# Patient Record
Sex: Male | Born: 1945
Health system: Southern US, Community
[De-identification: ages and names within clinical notes are randomized; demographics above are authoritative.]

## PROBLEM LIST (undated history)

## (undated) DIAGNOSIS — I429 Cardiomyopathy, unspecified: Secondary | ICD-10-CM

## (undated) DIAGNOSIS — E785 Hyperlipidemia, unspecified: Secondary | ICD-10-CM

## (undated) DIAGNOSIS — M109 Gout, unspecified: Secondary | ICD-10-CM

## (undated) DIAGNOSIS — E1121 Type 2 diabetes mellitus with diabetic nephropathy: Secondary | ICD-10-CM

## (undated) DIAGNOSIS — N400 Enlarged prostate without lower urinary tract symptoms: Secondary | ICD-10-CM

## (undated) DIAGNOSIS — Z8711 Personal history of peptic ulcer disease: Secondary | ICD-10-CM

## (undated) DIAGNOSIS — I5032 Chronic diastolic (congestive) heart failure: Secondary | ICD-10-CM

## (undated) DIAGNOSIS — I447 Left bundle-branch block, unspecified: Secondary | ICD-10-CM

## (undated) DIAGNOSIS — N189 Chronic kidney disease, unspecified: Secondary | ICD-10-CM

## (undated) DIAGNOSIS — I4891 Unspecified atrial fibrillation: Secondary | ICD-10-CM

## (undated) DIAGNOSIS — E119 Type 2 diabetes mellitus without complications: Secondary | ICD-10-CM

## (undated) DIAGNOSIS — I872 Venous insufficiency (chronic) (peripheral): Secondary | ICD-10-CM

## (undated) DIAGNOSIS — K219 Gastro-esophageal reflux disease without esophagitis: Secondary | ICD-10-CM

## (undated) DIAGNOSIS — D696 Thrombocytopenia, unspecified: Secondary | ICD-10-CM

## (undated) DIAGNOSIS — N183 Chronic kidney disease, stage 3 unspecified: Secondary | ICD-10-CM

## (undated) DIAGNOSIS — I509 Heart failure, unspecified: Secondary | ICD-10-CM

## (undated) DIAGNOSIS — I4719 Other supraventricular tachycardia: Secondary | ICD-10-CM

## (undated) HISTORY — DX: Thrombocytopenia, unspecified: D69.6

## (undated) HISTORY — DX: Type 2 diabetes mellitus with diabetic nephropathy: E11.21

## (undated) HISTORY — DX: Chronic diastolic (congestive) heart failure: I50.32

## (undated) HISTORY — DX: Cardiomyopathy, unspecified: I42.9

## (undated) HISTORY — DX: Venous insufficiency (chronic) (peripheral): I87.2

## (undated) HISTORY — DX: Personal history of peptic ulcer disease: Z87.11

## (undated) HISTORY — DX: Left bundle-branch block, unspecified: I44.7

## (undated) HISTORY — DX: Chronic kidney disease, stage 3 unspecified: N18.30

## (undated) HISTORY — DX: Benign prostatic hyperplasia without lower urinary tract symptoms: N40.0

## (undated) HISTORY — DX: Gastro-esophageal reflux disease without esophagitis: K21.9

## (undated) HISTORY — PX: HERNIA REPAIR: SHX51

## (undated) HISTORY — DX: Other supraventricular tachycardia: I47.19

## (undated) HISTORY — DX: Hyperlipidemia, unspecified: E78.5

---

## 2011-02-23 DIAGNOSIS — I10 Essential (primary) hypertension: Secondary | ICD-10-CM | POA: Diagnosis not present

## 2011-02-23 DIAGNOSIS — I509 Heart failure, unspecified: Secondary | ICD-10-CM | POA: Diagnosis not present

## 2011-06-01 DIAGNOSIS — E119 Type 2 diabetes mellitus without complications: Secondary | ICD-10-CM | POA: Diagnosis not present

## 2011-06-01 DIAGNOSIS — I4891 Unspecified atrial fibrillation: Secondary | ICD-10-CM | POA: Diagnosis not present

## 2011-06-01 DIAGNOSIS — N400 Enlarged prostate without lower urinary tract symptoms: Secondary | ICD-10-CM | POA: Diagnosis not present

## 2011-06-01 DIAGNOSIS — R3911 Hesitancy of micturition: Secondary | ICD-10-CM | POA: Diagnosis not present

## 2011-06-01 DIAGNOSIS — E785 Hyperlipidemia, unspecified: Secondary | ICD-10-CM | POA: Diagnosis not present

## 2011-07-05 DIAGNOSIS — K219 Gastro-esophageal reflux disease without esophagitis: Secondary | ICD-10-CM | POA: Diagnosis not present

## 2011-07-30 DIAGNOSIS — D126 Benign neoplasm of colon, unspecified: Secondary | ICD-10-CM | POA: Diagnosis not present

## 2011-07-30 DIAGNOSIS — Z794 Long term (current) use of insulin: Secondary | ICD-10-CM | POA: Diagnosis not present

## 2011-07-30 DIAGNOSIS — I509 Heart failure, unspecified: Secondary | ICD-10-CM | POA: Diagnosis not present

## 2011-07-30 DIAGNOSIS — I10 Essential (primary) hypertension: Secondary | ICD-10-CM | POA: Diagnosis not present

## 2011-07-30 DIAGNOSIS — Z8719 Personal history of other diseases of the digestive system: Secondary | ICD-10-CM | POA: Diagnosis not present

## 2011-07-30 DIAGNOSIS — Z7982 Long term (current) use of aspirin: Secondary | ICD-10-CM | POA: Diagnosis not present

## 2011-07-30 DIAGNOSIS — Z79899 Other long term (current) drug therapy: Secondary | ICD-10-CM | POA: Diagnosis not present

## 2011-07-30 DIAGNOSIS — E119 Type 2 diabetes mellitus without complications: Secondary | ICD-10-CM | POA: Diagnosis not present

## 2011-07-30 DIAGNOSIS — M129 Arthropathy, unspecified: Secondary | ICD-10-CM | POA: Diagnosis not present

## 2011-07-30 DIAGNOSIS — K219 Gastro-esophageal reflux disease without esophagitis: Secondary | ICD-10-CM | POA: Diagnosis not present

## 2011-07-30 DIAGNOSIS — Z1211 Encounter for screening for malignant neoplasm of colon: Secondary | ICD-10-CM | POA: Diagnosis not present

## 2011-07-30 DIAGNOSIS — I4891 Unspecified atrial fibrillation: Secondary | ICD-10-CM | POA: Diagnosis not present

## 2011-07-30 DIAGNOSIS — E785 Hyperlipidemia, unspecified: Secondary | ICD-10-CM | POA: Diagnosis not present

## 2011-09-08 DIAGNOSIS — I10 Essential (primary) hypertension: Secondary | ICD-10-CM | POA: Diagnosis not present

## 2011-09-08 DIAGNOSIS — E785 Hyperlipidemia, unspecified: Secondary | ICD-10-CM | POA: Diagnosis not present

## 2011-09-08 DIAGNOSIS — I4891 Unspecified atrial fibrillation: Secondary | ICD-10-CM | POA: Diagnosis not present

## 2011-09-15 DIAGNOSIS — Z23 Encounter for immunization: Secondary | ICD-10-CM | POA: Diagnosis not present

## 2011-12-13 DIAGNOSIS — I10 Essential (primary) hypertension: Secondary | ICD-10-CM | POA: Diagnosis not present

## 2011-12-13 DIAGNOSIS — E785 Hyperlipidemia, unspecified: Secondary | ICD-10-CM | POA: Diagnosis not present

## 2011-12-13 DIAGNOSIS — E119 Type 2 diabetes mellitus without complications: Secondary | ICD-10-CM | POA: Diagnosis not present

## 2012-03-16 DIAGNOSIS — E785 Hyperlipidemia, unspecified: Secondary | ICD-10-CM | POA: Diagnosis not present

## 2012-03-16 DIAGNOSIS — I10 Essential (primary) hypertension: Secondary | ICD-10-CM | POA: Diagnosis not present

## 2012-06-28 DIAGNOSIS — N400 Enlarged prostate without lower urinary tract symptoms: Secondary | ICD-10-CM | POA: Diagnosis not present

## 2012-06-28 DIAGNOSIS — I509 Heart failure, unspecified: Secondary | ICD-10-CM | POA: Diagnosis not present

## 2012-06-28 DIAGNOSIS — I4891 Unspecified atrial fibrillation: Secondary | ICD-10-CM | POA: Diagnosis not present

## 2012-06-28 DIAGNOSIS — L909 Atrophic disorder of skin, unspecified: Secondary | ICD-10-CM | POA: Diagnosis not present

## 2012-06-28 DIAGNOSIS — Z1212 Encounter for screening for malignant neoplasm of rectum: Secondary | ICD-10-CM | POA: Diagnosis not present

## 2012-06-28 DIAGNOSIS — E119 Type 2 diabetes mellitus without complications: Secondary | ICD-10-CM | POA: Diagnosis not present

## 2012-06-28 DIAGNOSIS — I10 Essential (primary) hypertension: Secondary | ICD-10-CM | POA: Diagnosis not present

## 2012-08-23 DIAGNOSIS — Z86010 Personal history of colonic polyps: Secondary | ICD-10-CM | POA: Diagnosis not present

## 2012-08-23 DIAGNOSIS — K219 Gastro-esophageal reflux disease without esophagitis: Secondary | ICD-10-CM | POA: Diagnosis not present

## 2012-09-21 DIAGNOSIS — Z860100 Personal history of colon polyps, unspecified: Secondary | ICD-10-CM | POA: Diagnosis not present

## 2012-09-21 DIAGNOSIS — E119 Type 2 diabetes mellitus without complications: Secondary | ICD-10-CM | POA: Diagnosis not present

## 2012-09-21 DIAGNOSIS — K648 Other hemorrhoids: Secondary | ICD-10-CM | POA: Diagnosis not present

## 2012-09-21 DIAGNOSIS — I509 Heart failure, unspecified: Secondary | ICD-10-CM | POA: Diagnosis not present

## 2012-09-21 DIAGNOSIS — I10 Essential (primary) hypertension: Secondary | ICD-10-CM | POA: Diagnosis not present

## 2012-09-21 DIAGNOSIS — I4891 Unspecified atrial fibrillation: Secondary | ICD-10-CM | POA: Diagnosis not present

## 2012-09-21 DIAGNOSIS — Z1211 Encounter for screening for malignant neoplasm of colon: Secondary | ICD-10-CM | POA: Diagnosis not present

## 2012-09-29 DIAGNOSIS — Z23 Encounter for immunization: Secondary | ICD-10-CM | POA: Diagnosis not present

## 2012-10-09 DIAGNOSIS — IMO0001 Reserved for inherently not codable concepts without codable children: Secondary | ICD-10-CM | POA: Diagnosis not present

## 2012-10-09 DIAGNOSIS — E785 Hyperlipidemia, unspecified: Secondary | ICD-10-CM | POA: Diagnosis not present

## 2012-10-09 DIAGNOSIS — E119 Type 2 diabetes mellitus without complications: Secondary | ICD-10-CM | POA: Diagnosis not present

## 2012-10-09 DIAGNOSIS — I10 Essential (primary) hypertension: Secondary | ICD-10-CM | POA: Diagnosis not present

## 2013-01-12 DIAGNOSIS — IMO0001 Reserved for inherently not codable concepts without codable children: Secondary | ICD-10-CM | POA: Diagnosis not present

## 2013-01-12 DIAGNOSIS — E785 Hyperlipidemia, unspecified: Secondary | ICD-10-CM | POA: Diagnosis not present

## 2013-03-17 DIAGNOSIS — A419 Sepsis, unspecified organism: Secondary | ICD-10-CM | POA: Diagnosis not present

## 2013-03-17 DIAGNOSIS — R7309 Other abnormal glucose: Secondary | ICD-10-CM | POA: Diagnosis not present

## 2013-03-17 DIAGNOSIS — I10 Essential (primary) hypertension: Secondary | ICD-10-CM | POA: Diagnosis not present

## 2013-03-17 DIAGNOSIS — M549 Dorsalgia, unspecified: Secondary | ICD-10-CM | POA: Diagnosis not present

## 2013-03-17 DIAGNOSIS — M7989 Other specified soft tissue disorders: Secondary | ICD-10-CM | POA: Diagnosis not present

## 2013-03-17 DIAGNOSIS — S298XXA Other specified injuries of thorax, initial encounter: Secondary | ICD-10-CM | POA: Diagnosis not present

## 2013-03-17 DIAGNOSIS — L03119 Cellulitis of unspecified part of limb: Secondary | ICD-10-CM | POA: Diagnosis not present

## 2013-03-17 DIAGNOSIS — M6282 Rhabdomyolysis: Secondary | ICD-10-CM | POA: Diagnosis not present

## 2013-03-17 DIAGNOSIS — E7800 Pure hypercholesterolemia, unspecified: Secondary | ICD-10-CM | POA: Diagnosis not present

## 2013-03-17 DIAGNOSIS — R0902 Hypoxemia: Secondary | ICD-10-CM | POA: Diagnosis not present

## 2013-03-17 DIAGNOSIS — I509 Heart failure, unspecified: Secondary | ICD-10-CM | POA: Diagnosis not present

## 2013-03-17 DIAGNOSIS — E1159 Type 2 diabetes mellitus with other circulatory complications: Secondary | ICD-10-CM | POA: Diagnosis not present

## 2013-03-17 DIAGNOSIS — M79609 Pain in unspecified limb: Secondary | ICD-10-CM | POA: Diagnosis not present

## 2013-03-17 DIAGNOSIS — I2510 Atherosclerotic heart disease of native coronary artery without angina pectoris: Secondary | ICD-10-CM | POA: Diagnosis not present

## 2013-03-17 DIAGNOSIS — Z23 Encounter for immunization: Secondary | ICD-10-CM | POA: Diagnosis not present

## 2013-03-17 DIAGNOSIS — E1169 Type 2 diabetes mellitus with other specified complication: Secondary | ICD-10-CM | POA: Diagnosis not present

## 2013-03-17 DIAGNOSIS — J9600 Acute respiratory failure, unspecified whether with hypoxia or hypercapnia: Secondary | ICD-10-CM | POA: Diagnosis not present

## 2013-03-17 DIAGNOSIS — Z794 Long term (current) use of insulin: Secondary | ICD-10-CM | POA: Diagnosis not present

## 2013-03-17 DIAGNOSIS — Z880 Allergy status to penicillin: Secondary | ICD-10-CM | POA: Diagnosis not present

## 2013-03-17 DIAGNOSIS — Z79899 Other long term (current) drug therapy: Secondary | ICD-10-CM | POA: Diagnosis not present

## 2013-03-17 DIAGNOSIS — Z452 Encounter for adjustment and management of vascular access device: Secondary | ICD-10-CM | POA: Diagnosis not present

## 2013-03-17 DIAGNOSIS — R262 Difficulty in walking, not elsewhere classified: Secondary | ICD-10-CM | POA: Diagnosis not present

## 2013-03-17 DIAGNOSIS — Z7401 Bed confinement status: Secondary | ICD-10-CM | POA: Diagnosis not present

## 2013-03-17 DIAGNOSIS — M5137 Other intervertebral disc degeneration, lumbosacral region: Secondary | ICD-10-CM | POA: Diagnosis not present

## 2013-03-17 DIAGNOSIS — G8929 Other chronic pain: Secondary | ICD-10-CM | POA: Diagnosis present

## 2013-03-17 DIAGNOSIS — D72829 Elevated white blood cell count, unspecified: Secondary | ICD-10-CM | POA: Diagnosis not present

## 2013-03-17 DIAGNOSIS — R52 Pain, unspecified: Secondary | ICD-10-CM | POA: Diagnosis not present

## 2013-03-17 DIAGNOSIS — M47817 Spondylosis without myelopathy or radiculopathy, lumbosacral region: Secondary | ICD-10-CM | POA: Diagnosis not present

## 2013-03-17 DIAGNOSIS — I999 Unspecified disorder of circulatory system: Secondary | ICD-10-CM | POA: Diagnosis not present

## 2013-03-17 DIAGNOSIS — IMO0001 Reserved for inherently not codable concepts without codable children: Secondary | ICD-10-CM | POA: Diagnosis not present

## 2013-03-17 DIAGNOSIS — D696 Thrombocytopenia, unspecified: Secondary | ICD-10-CM | POA: Diagnosis present

## 2013-03-17 DIAGNOSIS — R0602 Shortness of breath: Secondary | ICD-10-CM | POA: Diagnosis not present

## 2013-03-17 DIAGNOSIS — L02419 Cutaneous abscess of limb, unspecified: Secondary | ICD-10-CM | POA: Diagnosis not present

## 2013-03-17 DIAGNOSIS — I798 Other disorders of arteries, arterioles and capillaries in diseases classified elsewhere: Secondary | ICD-10-CM | POA: Diagnosis not present

## 2013-03-17 DIAGNOSIS — M2550 Pain in unspecified joint: Secondary | ICD-10-CM | POA: Diagnosis not present

## 2013-03-17 DIAGNOSIS — K824 Cholesterolosis of gallbladder: Secondary | ICD-10-CM | POA: Diagnosis present

## 2013-03-17 DIAGNOSIS — Z6841 Body Mass Index (BMI) 40.0 and over, adult: Secondary | ICD-10-CM | POA: Diagnosis not present

## 2013-03-17 DIAGNOSIS — N179 Acute kidney failure, unspecified: Secondary | ICD-10-CM | POA: Diagnosis present

## 2013-03-24 DIAGNOSIS — E785 Hyperlipidemia, unspecified: Secondary | ICD-10-CM | POA: Diagnosis not present

## 2013-03-24 DIAGNOSIS — A4902 Methicillin resistant Staphylococcus aureus infection, unspecified site: Secondary | ICD-10-CM | POA: Diagnosis not present

## 2013-03-24 DIAGNOSIS — K824 Cholesterolosis of gallbladder: Secondary | ICD-10-CM | POA: Diagnosis not present

## 2013-03-24 DIAGNOSIS — Z6841 Body Mass Index (BMI) 40.0 and over, adult: Secondary | ICD-10-CM | POA: Diagnosis not present

## 2013-03-24 DIAGNOSIS — R262 Difficulty in walking, not elsewhere classified: Secondary | ICD-10-CM | POA: Diagnosis not present

## 2013-03-24 DIAGNOSIS — Z7401 Bed confinement status: Secondary | ICD-10-CM | POA: Diagnosis not present

## 2013-03-24 DIAGNOSIS — I2510 Atherosclerotic heart disease of native coronary artery without angina pectoris: Secondary | ICD-10-CM | POA: Diagnosis not present

## 2013-03-24 DIAGNOSIS — I509 Heart failure, unspecified: Secondary | ICD-10-CM | POA: Diagnosis not present

## 2013-03-24 DIAGNOSIS — Z79899 Other long term (current) drug therapy: Secondary | ICD-10-CM | POA: Diagnosis not present

## 2013-03-24 DIAGNOSIS — E7800 Pure hypercholesterolemia, unspecified: Secondary | ICD-10-CM | POA: Diagnosis not present

## 2013-03-24 DIAGNOSIS — D72829 Elevated white blood cell count, unspecified: Secondary | ICD-10-CM | POA: Diagnosis not present

## 2013-03-24 DIAGNOSIS — L02419 Cutaneous abscess of limb, unspecified: Secondary | ICD-10-CM | POA: Diagnosis not present

## 2013-03-24 DIAGNOSIS — A419 Sepsis, unspecified organism: Secondary | ICD-10-CM | POA: Diagnosis not present

## 2013-03-24 DIAGNOSIS — I872 Venous insufficiency (chronic) (peripheral): Secondary | ICD-10-CM | POA: Diagnosis not present

## 2013-03-24 DIAGNOSIS — I130 Hypertensive heart and chronic kidney disease with heart failure and stage 1 through stage 4 chronic kidney disease, or unspecified chronic kidney disease: Secondary | ICD-10-CM | POA: Diagnosis not present

## 2013-03-24 DIAGNOSIS — N189 Chronic kidney disease, unspecified: Secondary | ICD-10-CM | POA: Diagnosis not present

## 2013-03-24 DIAGNOSIS — E1159 Type 2 diabetes mellitus with other circulatory complications: Secondary | ICD-10-CM | POA: Diagnosis not present

## 2013-03-24 DIAGNOSIS — I798 Other disorders of arteries, arterioles and capillaries in diseases classified elsewhere: Secondary | ICD-10-CM | POA: Diagnosis not present

## 2013-03-24 DIAGNOSIS — N039 Chronic nephritic syndrome with unspecified morphologic changes: Secondary | ICD-10-CM | POA: Diagnosis not present

## 2013-03-24 DIAGNOSIS — IMO0001 Reserved for inherently not codable concepts without codable children: Secondary | ICD-10-CM | POA: Diagnosis not present

## 2013-03-24 DIAGNOSIS — Z23 Encounter for immunization: Secondary | ICD-10-CM | POA: Diagnosis not present

## 2013-03-24 DIAGNOSIS — R5381 Other malaise: Secondary | ICD-10-CM | POA: Diagnosis not present

## 2013-03-24 DIAGNOSIS — Z880 Allergy status to penicillin: Secondary | ICD-10-CM | POA: Diagnosis not present

## 2013-03-24 DIAGNOSIS — N183 Chronic kidney disease, stage 3 unspecified: Secondary | ICD-10-CM | POA: Diagnosis not present

## 2013-03-24 DIAGNOSIS — D696 Thrombocytopenia, unspecified: Secondary | ICD-10-CM | POA: Diagnosis not present

## 2013-03-24 DIAGNOSIS — I7789 Other specified disorders of arteries and arterioles: Secondary | ICD-10-CM | POA: Diagnosis not present

## 2013-03-24 DIAGNOSIS — M6282 Rhabdomyolysis: Secondary | ICD-10-CM | POA: Diagnosis not present

## 2013-03-24 DIAGNOSIS — Z794 Long term (current) use of insulin: Secondary | ICD-10-CM | POA: Diagnosis not present

## 2013-03-24 DIAGNOSIS — M549 Dorsalgia, unspecified: Secondary | ICD-10-CM | POA: Diagnosis not present

## 2013-03-24 DIAGNOSIS — M2550 Pain in unspecified joint: Secondary | ICD-10-CM | POA: Diagnosis not present

## 2013-03-24 DIAGNOSIS — E7889 Other lipoprotein metabolism disorders: Secondary | ICD-10-CM | POA: Diagnosis not present

## 2013-03-24 DIAGNOSIS — I10 Essential (primary) hypertension: Secondary | ICD-10-CM | POA: Diagnosis not present

## 2013-03-24 DIAGNOSIS — R7309 Other abnormal glucose: Secondary | ICD-10-CM | POA: Diagnosis not present

## 2013-03-24 DIAGNOSIS — G8929 Other chronic pain: Secondary | ICD-10-CM | POA: Diagnosis not present

## 2013-03-24 DIAGNOSIS — J9600 Acute respiratory failure, unspecified whether with hypoxia or hypercapnia: Secondary | ICD-10-CM | POA: Diagnosis not present

## 2013-03-24 DIAGNOSIS — N179 Acute kidney failure, unspecified: Secondary | ICD-10-CM | POA: Diagnosis not present

## 2013-03-24 DIAGNOSIS — R52 Pain, unspecified: Secondary | ICD-10-CM | POA: Diagnosis not present

## 2013-03-26 DIAGNOSIS — N183 Chronic kidney disease, stage 3 unspecified: Secondary | ICD-10-CM | POA: Diagnosis not present

## 2013-03-26 DIAGNOSIS — E1159 Type 2 diabetes mellitus with other circulatory complications: Secondary | ICD-10-CM | POA: Diagnosis not present

## 2013-03-26 DIAGNOSIS — L02419 Cutaneous abscess of limb, unspecified: Secondary | ICD-10-CM | POA: Diagnosis not present

## 2013-03-26 DIAGNOSIS — I130 Hypertensive heart and chronic kidney disease with heart failure and stage 1 through stage 4 chronic kidney disease, or unspecified chronic kidney disease: Secondary | ICD-10-CM | POA: Diagnosis not present

## 2013-03-26 DIAGNOSIS — L03119 Cellulitis of unspecified part of limb: Secondary | ICD-10-CM | POA: Diagnosis not present

## 2013-04-14 DIAGNOSIS — I890 Lymphedema, not elsewhere classified: Secondary | ICD-10-CM | POA: Diagnosis not present

## 2013-04-14 DIAGNOSIS — M1710 Unilateral primary osteoarthritis, unspecified knee: Secondary | ICD-10-CM | POA: Diagnosis not present

## 2013-04-14 DIAGNOSIS — I798 Other disorders of arteries, arterioles and capillaries in diseases classified elsewhere: Secondary | ICD-10-CM | POA: Diagnosis not present

## 2013-04-14 DIAGNOSIS — E1159 Type 2 diabetes mellitus with other circulatory complications: Secondary | ICD-10-CM | POA: Diagnosis not present

## 2013-04-14 DIAGNOSIS — I129 Hypertensive chronic kidney disease with stage 1 through stage 4 chronic kidney disease, or unspecified chronic kidney disease: Secondary | ICD-10-CM | POA: Diagnosis not present

## 2013-04-14 DIAGNOSIS — M6282 Rhabdomyolysis: Secondary | ICD-10-CM | POA: Diagnosis not present

## 2013-04-14 DIAGNOSIS — L97809 Non-pressure chronic ulcer of other part of unspecified lower leg with unspecified severity: Secondary | ICD-10-CM | POA: Diagnosis not present

## 2013-04-14 DIAGNOSIS — I872 Venous insufficiency (chronic) (peripheral): Secondary | ICD-10-CM | POA: Diagnosis not present

## 2013-04-14 DIAGNOSIS — L97209 Non-pressure chronic ulcer of unspecified calf with unspecified severity: Secondary | ICD-10-CM | POA: Diagnosis not present

## 2013-04-14 DIAGNOSIS — Z794 Long term (current) use of insulin: Secondary | ICD-10-CM | POA: Diagnosis not present

## 2013-04-14 DIAGNOSIS — Z9181 History of falling: Secondary | ICD-10-CM | POA: Diagnosis not present

## 2013-04-14 DIAGNOSIS — N189 Chronic kidney disease, unspecified: Secondary | ICD-10-CM | POA: Diagnosis not present

## 2013-04-17 DIAGNOSIS — I798 Other disorders of arteries, arterioles and capillaries in diseases classified elsewhere: Secondary | ICD-10-CM | POA: Diagnosis not present

## 2013-04-17 DIAGNOSIS — L97209 Non-pressure chronic ulcer of unspecified calf with unspecified severity: Secondary | ICD-10-CM | POA: Diagnosis not present

## 2013-04-17 DIAGNOSIS — I872 Venous insufficiency (chronic) (peripheral): Secondary | ICD-10-CM | POA: Diagnosis not present

## 2013-04-17 DIAGNOSIS — E1159 Type 2 diabetes mellitus with other circulatory complications: Secondary | ICD-10-CM | POA: Diagnosis not present

## 2013-04-17 DIAGNOSIS — L97809 Non-pressure chronic ulcer of other part of unspecified lower leg with unspecified severity: Secondary | ICD-10-CM | POA: Diagnosis not present

## 2013-04-17 DIAGNOSIS — M6282 Rhabdomyolysis: Secondary | ICD-10-CM | POA: Diagnosis not present

## 2013-04-19 DIAGNOSIS — I10 Essential (primary) hypertension: Secondary | ICD-10-CM | POA: Diagnosis not present

## 2013-04-19 DIAGNOSIS — E785 Hyperlipidemia, unspecified: Secondary | ICD-10-CM | POA: Diagnosis not present

## 2013-04-19 DIAGNOSIS — E119 Type 2 diabetes mellitus without complications: Secondary | ICD-10-CM | POA: Diagnosis not present

## 2013-04-20 DIAGNOSIS — I872 Venous insufficiency (chronic) (peripheral): Secondary | ICD-10-CM | POA: Diagnosis not present

## 2013-04-20 DIAGNOSIS — M6282 Rhabdomyolysis: Secondary | ICD-10-CM | POA: Diagnosis not present

## 2013-04-20 DIAGNOSIS — L97809 Non-pressure chronic ulcer of other part of unspecified lower leg with unspecified severity: Secondary | ICD-10-CM | POA: Diagnosis not present

## 2013-04-20 DIAGNOSIS — L97209 Non-pressure chronic ulcer of unspecified calf with unspecified severity: Secondary | ICD-10-CM | POA: Diagnosis not present

## 2013-04-20 DIAGNOSIS — E1159 Type 2 diabetes mellitus with other circulatory complications: Secondary | ICD-10-CM | POA: Diagnosis not present

## 2013-04-20 DIAGNOSIS — I798 Other disorders of arteries, arterioles and capillaries in diseases classified elsewhere: Secondary | ICD-10-CM | POA: Diagnosis not present

## 2013-04-23 DIAGNOSIS — L97809 Non-pressure chronic ulcer of other part of unspecified lower leg with unspecified severity: Secondary | ICD-10-CM | POA: Diagnosis not present

## 2013-04-23 DIAGNOSIS — I872 Venous insufficiency (chronic) (peripheral): Secondary | ICD-10-CM | POA: Diagnosis not present

## 2013-04-23 DIAGNOSIS — M6282 Rhabdomyolysis: Secondary | ICD-10-CM | POA: Diagnosis not present

## 2013-04-23 DIAGNOSIS — I798 Other disorders of arteries, arterioles and capillaries in diseases classified elsewhere: Secondary | ICD-10-CM | POA: Diagnosis not present

## 2013-04-23 DIAGNOSIS — E1159 Type 2 diabetes mellitus with other circulatory complications: Secondary | ICD-10-CM | POA: Diagnosis not present

## 2013-04-23 DIAGNOSIS — L97209 Non-pressure chronic ulcer of unspecified calf with unspecified severity: Secondary | ICD-10-CM | POA: Diagnosis not present

## 2013-04-26 DIAGNOSIS — M6282 Rhabdomyolysis: Secondary | ICD-10-CM | POA: Diagnosis not present

## 2013-04-26 DIAGNOSIS — E1159 Type 2 diabetes mellitus with other circulatory complications: Secondary | ICD-10-CM | POA: Diagnosis not present

## 2013-04-26 DIAGNOSIS — I798 Other disorders of arteries, arterioles and capillaries in diseases classified elsewhere: Secondary | ICD-10-CM | POA: Diagnosis not present

## 2013-04-26 DIAGNOSIS — I872 Venous insufficiency (chronic) (peripheral): Secondary | ICD-10-CM | POA: Diagnosis not present

## 2013-04-26 DIAGNOSIS — L97809 Non-pressure chronic ulcer of other part of unspecified lower leg with unspecified severity: Secondary | ICD-10-CM | POA: Diagnosis not present

## 2013-04-26 DIAGNOSIS — L97209 Non-pressure chronic ulcer of unspecified calf with unspecified severity: Secondary | ICD-10-CM | POA: Diagnosis not present

## 2013-04-30 DIAGNOSIS — I798 Other disorders of arteries, arterioles and capillaries in diseases classified elsewhere: Secondary | ICD-10-CM | POA: Diagnosis not present

## 2013-04-30 DIAGNOSIS — I872 Venous insufficiency (chronic) (peripheral): Secondary | ICD-10-CM | POA: Diagnosis not present

## 2013-04-30 DIAGNOSIS — L97209 Non-pressure chronic ulcer of unspecified calf with unspecified severity: Secondary | ICD-10-CM | POA: Diagnosis not present

## 2013-04-30 DIAGNOSIS — E1159 Type 2 diabetes mellitus with other circulatory complications: Secondary | ICD-10-CM | POA: Diagnosis not present

## 2013-04-30 DIAGNOSIS — L97809 Non-pressure chronic ulcer of other part of unspecified lower leg with unspecified severity: Secondary | ICD-10-CM | POA: Diagnosis not present

## 2013-04-30 DIAGNOSIS — M6282 Rhabdomyolysis: Secondary | ICD-10-CM | POA: Diagnosis not present

## 2013-05-03 DIAGNOSIS — E1159 Type 2 diabetes mellitus with other circulatory complications: Secondary | ICD-10-CM | POA: Diagnosis not present

## 2013-05-03 DIAGNOSIS — I872 Venous insufficiency (chronic) (peripheral): Secondary | ICD-10-CM | POA: Diagnosis not present

## 2013-05-03 DIAGNOSIS — L97209 Non-pressure chronic ulcer of unspecified calf with unspecified severity: Secondary | ICD-10-CM | POA: Diagnosis not present

## 2013-05-03 DIAGNOSIS — M6282 Rhabdomyolysis: Secondary | ICD-10-CM | POA: Diagnosis not present

## 2013-05-03 DIAGNOSIS — L97809 Non-pressure chronic ulcer of other part of unspecified lower leg with unspecified severity: Secondary | ICD-10-CM | POA: Diagnosis not present

## 2013-05-03 DIAGNOSIS — I798 Other disorders of arteries, arterioles and capillaries in diseases classified elsewhere: Secondary | ICD-10-CM | POA: Diagnosis not present

## 2013-05-07 DIAGNOSIS — E1159 Type 2 diabetes mellitus with other circulatory complications: Secondary | ICD-10-CM | POA: Diagnosis not present

## 2013-05-07 DIAGNOSIS — I872 Venous insufficiency (chronic) (peripheral): Secondary | ICD-10-CM | POA: Diagnosis not present

## 2013-05-07 DIAGNOSIS — I798 Other disorders of arteries, arterioles and capillaries in diseases classified elsewhere: Secondary | ICD-10-CM | POA: Diagnosis not present

## 2013-05-07 DIAGNOSIS — L97209 Non-pressure chronic ulcer of unspecified calf with unspecified severity: Secondary | ICD-10-CM | POA: Diagnosis not present

## 2013-05-07 DIAGNOSIS — L97809 Non-pressure chronic ulcer of other part of unspecified lower leg with unspecified severity: Secondary | ICD-10-CM | POA: Diagnosis not present

## 2013-05-07 DIAGNOSIS — M6282 Rhabdomyolysis: Secondary | ICD-10-CM | POA: Diagnosis not present

## 2013-05-10 DIAGNOSIS — M6282 Rhabdomyolysis: Secondary | ICD-10-CM | POA: Diagnosis not present

## 2013-05-10 DIAGNOSIS — L97809 Non-pressure chronic ulcer of other part of unspecified lower leg with unspecified severity: Secondary | ICD-10-CM | POA: Diagnosis not present

## 2013-05-10 DIAGNOSIS — L97209 Non-pressure chronic ulcer of unspecified calf with unspecified severity: Secondary | ICD-10-CM | POA: Diagnosis not present

## 2013-05-10 DIAGNOSIS — I798 Other disorders of arteries, arterioles and capillaries in diseases classified elsewhere: Secondary | ICD-10-CM | POA: Diagnosis not present

## 2013-05-10 DIAGNOSIS — E1159 Type 2 diabetes mellitus with other circulatory complications: Secondary | ICD-10-CM | POA: Diagnosis not present

## 2013-05-10 DIAGNOSIS — I872 Venous insufficiency (chronic) (peripheral): Secondary | ICD-10-CM | POA: Diagnosis not present

## 2013-05-14 DIAGNOSIS — L97209 Non-pressure chronic ulcer of unspecified calf with unspecified severity: Secondary | ICD-10-CM | POA: Diagnosis not present

## 2013-05-14 DIAGNOSIS — M6282 Rhabdomyolysis: Secondary | ICD-10-CM | POA: Diagnosis not present

## 2013-05-14 DIAGNOSIS — I872 Venous insufficiency (chronic) (peripheral): Secondary | ICD-10-CM | POA: Diagnosis not present

## 2013-05-14 DIAGNOSIS — E1159 Type 2 diabetes mellitus with other circulatory complications: Secondary | ICD-10-CM | POA: Diagnosis not present

## 2013-05-14 DIAGNOSIS — I798 Other disorders of arteries, arterioles and capillaries in diseases classified elsewhere: Secondary | ICD-10-CM | POA: Diagnosis not present

## 2013-05-14 DIAGNOSIS — L97809 Non-pressure chronic ulcer of other part of unspecified lower leg with unspecified severity: Secondary | ICD-10-CM | POA: Diagnosis not present

## 2013-05-17 DIAGNOSIS — L97809 Non-pressure chronic ulcer of other part of unspecified lower leg with unspecified severity: Secondary | ICD-10-CM | POA: Diagnosis not present

## 2013-05-17 DIAGNOSIS — M6282 Rhabdomyolysis: Secondary | ICD-10-CM | POA: Diagnosis not present

## 2013-05-17 DIAGNOSIS — E1159 Type 2 diabetes mellitus with other circulatory complications: Secondary | ICD-10-CM | POA: Diagnosis not present

## 2013-05-17 DIAGNOSIS — I798 Other disorders of arteries, arterioles and capillaries in diseases classified elsewhere: Secondary | ICD-10-CM | POA: Diagnosis not present

## 2013-05-17 DIAGNOSIS — L97209 Non-pressure chronic ulcer of unspecified calf with unspecified severity: Secondary | ICD-10-CM | POA: Diagnosis not present

## 2013-05-17 DIAGNOSIS — I872 Venous insufficiency (chronic) (peripheral): Secondary | ICD-10-CM | POA: Diagnosis not present

## 2013-05-22 DIAGNOSIS — L97209 Non-pressure chronic ulcer of unspecified calf with unspecified severity: Secondary | ICD-10-CM | POA: Diagnosis not present

## 2013-05-22 DIAGNOSIS — I798 Other disorders of arteries, arterioles and capillaries in diseases classified elsewhere: Secondary | ICD-10-CM | POA: Diagnosis not present

## 2013-05-22 DIAGNOSIS — I872 Venous insufficiency (chronic) (peripheral): Secondary | ICD-10-CM | POA: Diagnosis not present

## 2013-05-22 DIAGNOSIS — L97809 Non-pressure chronic ulcer of other part of unspecified lower leg with unspecified severity: Secondary | ICD-10-CM | POA: Diagnosis not present

## 2013-05-22 DIAGNOSIS — M6282 Rhabdomyolysis: Secondary | ICD-10-CM | POA: Diagnosis not present

## 2013-05-22 DIAGNOSIS — E1159 Type 2 diabetes mellitus with other circulatory complications: Secondary | ICD-10-CM | POA: Diagnosis not present

## 2013-05-24 DIAGNOSIS — L97209 Non-pressure chronic ulcer of unspecified calf with unspecified severity: Secondary | ICD-10-CM | POA: Diagnosis not present

## 2013-05-24 DIAGNOSIS — E1159 Type 2 diabetes mellitus with other circulatory complications: Secondary | ICD-10-CM | POA: Diagnosis not present

## 2013-05-24 DIAGNOSIS — I798 Other disorders of arteries, arterioles and capillaries in diseases classified elsewhere: Secondary | ICD-10-CM | POA: Diagnosis not present

## 2013-05-24 DIAGNOSIS — L97809 Non-pressure chronic ulcer of other part of unspecified lower leg with unspecified severity: Secondary | ICD-10-CM | POA: Diagnosis not present

## 2013-05-24 DIAGNOSIS — M6282 Rhabdomyolysis: Secondary | ICD-10-CM | POA: Diagnosis not present

## 2013-05-24 DIAGNOSIS — I872 Venous insufficiency (chronic) (peripheral): Secondary | ICD-10-CM | POA: Diagnosis not present

## 2013-05-29 DIAGNOSIS — M6282 Rhabdomyolysis: Secondary | ICD-10-CM | POA: Diagnosis not present

## 2013-05-29 DIAGNOSIS — L97809 Non-pressure chronic ulcer of other part of unspecified lower leg with unspecified severity: Secondary | ICD-10-CM | POA: Diagnosis not present

## 2013-05-29 DIAGNOSIS — I872 Venous insufficiency (chronic) (peripheral): Secondary | ICD-10-CM | POA: Diagnosis not present

## 2013-05-29 DIAGNOSIS — L97209 Non-pressure chronic ulcer of unspecified calf with unspecified severity: Secondary | ICD-10-CM | POA: Diagnosis not present

## 2013-05-29 DIAGNOSIS — E1159 Type 2 diabetes mellitus with other circulatory complications: Secondary | ICD-10-CM | POA: Diagnosis not present

## 2013-05-29 DIAGNOSIS — I798 Other disorders of arteries, arterioles and capillaries in diseases classified elsewhere: Secondary | ICD-10-CM | POA: Diagnosis not present

## 2013-05-31 DIAGNOSIS — M6282 Rhabdomyolysis: Secondary | ICD-10-CM | POA: Diagnosis not present

## 2013-05-31 DIAGNOSIS — L97209 Non-pressure chronic ulcer of unspecified calf with unspecified severity: Secondary | ICD-10-CM | POA: Diagnosis not present

## 2013-05-31 DIAGNOSIS — L97809 Non-pressure chronic ulcer of other part of unspecified lower leg with unspecified severity: Secondary | ICD-10-CM | POA: Diagnosis not present

## 2013-05-31 DIAGNOSIS — I798 Other disorders of arteries, arterioles and capillaries in diseases classified elsewhere: Secondary | ICD-10-CM | POA: Diagnosis not present

## 2013-05-31 DIAGNOSIS — I872 Venous insufficiency (chronic) (peripheral): Secondary | ICD-10-CM | POA: Diagnosis not present

## 2013-05-31 DIAGNOSIS — E1159 Type 2 diabetes mellitus with other circulatory complications: Secondary | ICD-10-CM | POA: Diagnosis not present

## 2013-06-04 DIAGNOSIS — L97809 Non-pressure chronic ulcer of other part of unspecified lower leg with unspecified severity: Secondary | ICD-10-CM | POA: Diagnosis not present

## 2013-06-04 DIAGNOSIS — M6282 Rhabdomyolysis: Secondary | ICD-10-CM | POA: Diagnosis not present

## 2013-06-04 DIAGNOSIS — E1159 Type 2 diabetes mellitus with other circulatory complications: Secondary | ICD-10-CM | POA: Diagnosis not present

## 2013-06-04 DIAGNOSIS — I798 Other disorders of arteries, arterioles and capillaries in diseases classified elsewhere: Secondary | ICD-10-CM | POA: Diagnosis not present

## 2013-06-04 DIAGNOSIS — I872 Venous insufficiency (chronic) (peripheral): Secondary | ICD-10-CM | POA: Diagnosis not present

## 2013-06-04 DIAGNOSIS — L97209 Non-pressure chronic ulcer of unspecified calf with unspecified severity: Secondary | ICD-10-CM | POA: Diagnosis not present

## 2013-06-08 DIAGNOSIS — I872 Venous insufficiency (chronic) (peripheral): Secondary | ICD-10-CM | POA: Diagnosis not present

## 2013-06-08 DIAGNOSIS — M6282 Rhabdomyolysis: Secondary | ICD-10-CM | POA: Diagnosis not present

## 2013-06-08 DIAGNOSIS — E1159 Type 2 diabetes mellitus with other circulatory complications: Secondary | ICD-10-CM | POA: Diagnosis not present

## 2013-06-08 DIAGNOSIS — I798 Other disorders of arteries, arterioles and capillaries in diseases classified elsewhere: Secondary | ICD-10-CM | POA: Diagnosis not present

## 2013-06-08 DIAGNOSIS — L97809 Non-pressure chronic ulcer of other part of unspecified lower leg with unspecified severity: Secondary | ICD-10-CM | POA: Diagnosis not present

## 2013-06-08 DIAGNOSIS — L97209 Non-pressure chronic ulcer of unspecified calf with unspecified severity: Secondary | ICD-10-CM | POA: Diagnosis not present

## 2013-06-12 DIAGNOSIS — I798 Other disorders of arteries, arterioles and capillaries in diseases classified elsewhere: Secondary | ICD-10-CM | POA: Diagnosis not present

## 2013-06-12 DIAGNOSIS — E1159 Type 2 diabetes mellitus with other circulatory complications: Secondary | ICD-10-CM | POA: Diagnosis not present

## 2013-06-12 DIAGNOSIS — M6282 Rhabdomyolysis: Secondary | ICD-10-CM | POA: Diagnosis not present

## 2013-06-12 DIAGNOSIS — L97809 Non-pressure chronic ulcer of other part of unspecified lower leg with unspecified severity: Secondary | ICD-10-CM | POA: Diagnosis not present

## 2013-06-12 DIAGNOSIS — I872 Venous insufficiency (chronic) (peripheral): Secondary | ICD-10-CM | POA: Diagnosis not present

## 2013-06-12 DIAGNOSIS — L97209 Non-pressure chronic ulcer of unspecified calf with unspecified severity: Secondary | ICD-10-CM | POA: Diagnosis not present

## 2013-06-13 DIAGNOSIS — E1159 Type 2 diabetes mellitus with other circulatory complications: Secondary | ICD-10-CM | POA: Diagnosis not present

## 2013-06-13 DIAGNOSIS — Z9181 History of falling: Secondary | ICD-10-CM | POA: Diagnosis not present

## 2013-06-13 DIAGNOSIS — I798 Other disorders of arteries, arterioles and capillaries in diseases classified elsewhere: Secondary | ICD-10-CM | POA: Diagnosis not present

## 2013-06-13 DIAGNOSIS — Z4800 Encounter for change or removal of nonsurgical wound dressing: Secondary | ICD-10-CM | POA: Diagnosis not present

## 2013-06-13 DIAGNOSIS — I890 Lymphedema, not elsewhere classified: Secondary | ICD-10-CM | POA: Diagnosis not present

## 2013-06-13 DIAGNOSIS — M1710 Unilateral primary osteoarthritis, unspecified knee: Secondary | ICD-10-CM | POA: Diagnosis not present

## 2013-06-13 DIAGNOSIS — N189 Chronic kidney disease, unspecified: Secondary | ICD-10-CM | POA: Diagnosis not present

## 2013-06-13 DIAGNOSIS — I129 Hypertensive chronic kidney disease with stage 1 through stage 4 chronic kidney disease, or unspecified chronic kidney disease: Secondary | ICD-10-CM | POA: Diagnosis not present

## 2013-06-13 DIAGNOSIS — Z794 Long term (current) use of insulin: Secondary | ICD-10-CM | POA: Diagnosis not present

## 2013-06-13 DIAGNOSIS — I872 Venous insufficiency (chronic) (peripheral): Secondary | ICD-10-CM | POA: Diagnosis not present

## 2013-06-15 DIAGNOSIS — E1159 Type 2 diabetes mellitus with other circulatory complications: Secondary | ICD-10-CM | POA: Diagnosis not present

## 2013-06-15 DIAGNOSIS — I129 Hypertensive chronic kidney disease with stage 1 through stage 4 chronic kidney disease, or unspecified chronic kidney disease: Secondary | ICD-10-CM | POA: Diagnosis not present

## 2013-06-15 DIAGNOSIS — I872 Venous insufficiency (chronic) (peripheral): Secondary | ICD-10-CM | POA: Diagnosis not present

## 2013-06-15 DIAGNOSIS — N189 Chronic kidney disease, unspecified: Secondary | ICD-10-CM | POA: Diagnosis not present

## 2013-06-15 DIAGNOSIS — I798 Other disorders of arteries, arterioles and capillaries in diseases classified elsewhere: Secondary | ICD-10-CM | POA: Diagnosis not present

## 2013-06-15 DIAGNOSIS — I890 Lymphedema, not elsewhere classified: Secondary | ICD-10-CM | POA: Diagnosis not present

## 2013-06-18 DIAGNOSIS — N189 Chronic kidney disease, unspecified: Secondary | ICD-10-CM | POA: Diagnosis not present

## 2013-06-18 DIAGNOSIS — I890 Lymphedema, not elsewhere classified: Secondary | ICD-10-CM | POA: Diagnosis not present

## 2013-06-18 DIAGNOSIS — E1159 Type 2 diabetes mellitus with other circulatory complications: Secondary | ICD-10-CM | POA: Diagnosis not present

## 2013-06-18 DIAGNOSIS — I129 Hypertensive chronic kidney disease with stage 1 through stage 4 chronic kidney disease, or unspecified chronic kidney disease: Secondary | ICD-10-CM | POA: Diagnosis not present

## 2013-06-18 DIAGNOSIS — I872 Venous insufficiency (chronic) (peripheral): Secondary | ICD-10-CM | POA: Diagnosis not present

## 2013-06-18 DIAGNOSIS — I798 Other disorders of arteries, arterioles and capillaries in diseases classified elsewhere: Secondary | ICD-10-CM | POA: Diagnosis not present

## 2013-06-21 DIAGNOSIS — I798 Other disorders of arteries, arterioles and capillaries in diseases classified elsewhere: Secondary | ICD-10-CM | POA: Diagnosis not present

## 2013-06-21 DIAGNOSIS — I872 Venous insufficiency (chronic) (peripheral): Secondary | ICD-10-CM | POA: Diagnosis not present

## 2013-06-21 DIAGNOSIS — E1159 Type 2 diabetes mellitus with other circulatory complications: Secondary | ICD-10-CM | POA: Diagnosis not present

## 2013-06-21 DIAGNOSIS — I129 Hypertensive chronic kidney disease with stage 1 through stage 4 chronic kidney disease, or unspecified chronic kidney disease: Secondary | ICD-10-CM | POA: Diagnosis not present

## 2013-06-21 DIAGNOSIS — N189 Chronic kidney disease, unspecified: Secondary | ICD-10-CM | POA: Diagnosis not present

## 2013-06-21 DIAGNOSIS — I890 Lymphedema, not elsewhere classified: Secondary | ICD-10-CM | POA: Diagnosis not present

## 2013-06-25 DIAGNOSIS — I872 Venous insufficiency (chronic) (peripheral): Secondary | ICD-10-CM | POA: Diagnosis not present

## 2013-06-25 DIAGNOSIS — N189 Chronic kidney disease, unspecified: Secondary | ICD-10-CM | POA: Diagnosis not present

## 2013-06-25 DIAGNOSIS — I129 Hypertensive chronic kidney disease with stage 1 through stage 4 chronic kidney disease, or unspecified chronic kidney disease: Secondary | ICD-10-CM | POA: Diagnosis not present

## 2013-06-25 DIAGNOSIS — I890 Lymphedema, not elsewhere classified: Secondary | ICD-10-CM | POA: Diagnosis not present

## 2013-06-25 DIAGNOSIS — I798 Other disorders of arteries, arterioles and capillaries in diseases classified elsewhere: Secondary | ICD-10-CM | POA: Diagnosis not present

## 2013-06-25 DIAGNOSIS — E1159 Type 2 diabetes mellitus with other circulatory complications: Secondary | ICD-10-CM | POA: Diagnosis not present

## 2013-06-28 DIAGNOSIS — N189 Chronic kidney disease, unspecified: Secondary | ICD-10-CM | POA: Diagnosis not present

## 2013-06-28 DIAGNOSIS — E1159 Type 2 diabetes mellitus with other circulatory complications: Secondary | ICD-10-CM | POA: Diagnosis not present

## 2013-06-28 DIAGNOSIS — I890 Lymphedema, not elsewhere classified: Secondary | ICD-10-CM | POA: Diagnosis not present

## 2013-06-28 DIAGNOSIS — I798 Other disorders of arteries, arterioles and capillaries in diseases classified elsewhere: Secondary | ICD-10-CM | POA: Diagnosis not present

## 2013-06-28 DIAGNOSIS — I872 Venous insufficiency (chronic) (peripheral): Secondary | ICD-10-CM | POA: Diagnosis not present

## 2013-06-28 DIAGNOSIS — I129 Hypertensive chronic kidney disease with stage 1 through stage 4 chronic kidney disease, or unspecified chronic kidney disease: Secondary | ICD-10-CM | POA: Diagnosis not present

## 2013-07-03 DIAGNOSIS — I129 Hypertensive chronic kidney disease with stage 1 through stage 4 chronic kidney disease, or unspecified chronic kidney disease: Secondary | ICD-10-CM | POA: Diagnosis not present

## 2013-07-03 DIAGNOSIS — I890 Lymphedema, not elsewhere classified: Secondary | ICD-10-CM | POA: Diagnosis not present

## 2013-07-03 DIAGNOSIS — I798 Other disorders of arteries, arterioles and capillaries in diseases classified elsewhere: Secondary | ICD-10-CM | POA: Diagnosis not present

## 2013-07-03 DIAGNOSIS — N189 Chronic kidney disease, unspecified: Secondary | ICD-10-CM | POA: Diagnosis not present

## 2013-07-03 DIAGNOSIS — E1159 Type 2 diabetes mellitus with other circulatory complications: Secondary | ICD-10-CM | POA: Diagnosis not present

## 2013-07-03 DIAGNOSIS — I872 Venous insufficiency (chronic) (peripheral): Secondary | ICD-10-CM | POA: Diagnosis not present

## 2013-07-09 DIAGNOSIS — I129 Hypertensive chronic kidney disease with stage 1 through stage 4 chronic kidney disease, or unspecified chronic kidney disease: Secondary | ICD-10-CM | POA: Diagnosis not present

## 2013-07-09 DIAGNOSIS — N189 Chronic kidney disease, unspecified: Secondary | ICD-10-CM | POA: Diagnosis not present

## 2013-07-09 DIAGNOSIS — E1159 Type 2 diabetes mellitus with other circulatory complications: Secondary | ICD-10-CM | POA: Diagnosis not present

## 2013-07-09 DIAGNOSIS — I872 Venous insufficiency (chronic) (peripheral): Secondary | ICD-10-CM | POA: Diagnosis not present

## 2013-07-09 DIAGNOSIS — I890 Lymphedema, not elsewhere classified: Secondary | ICD-10-CM | POA: Diagnosis not present

## 2013-07-09 DIAGNOSIS — I798 Other disorders of arteries, arterioles and capillaries in diseases classified elsewhere: Secondary | ICD-10-CM | POA: Diagnosis not present

## 2013-07-12 DIAGNOSIS — I129 Hypertensive chronic kidney disease with stage 1 through stage 4 chronic kidney disease, or unspecified chronic kidney disease: Secondary | ICD-10-CM | POA: Diagnosis not present

## 2013-07-12 DIAGNOSIS — N189 Chronic kidney disease, unspecified: Secondary | ICD-10-CM | POA: Diagnosis not present

## 2013-07-12 DIAGNOSIS — I798 Other disorders of arteries, arterioles and capillaries in diseases classified elsewhere: Secondary | ICD-10-CM | POA: Diagnosis not present

## 2013-07-12 DIAGNOSIS — I890 Lymphedema, not elsewhere classified: Secondary | ICD-10-CM | POA: Diagnosis not present

## 2013-07-12 DIAGNOSIS — I872 Venous insufficiency (chronic) (peripheral): Secondary | ICD-10-CM | POA: Diagnosis not present

## 2013-07-12 DIAGNOSIS — E1159 Type 2 diabetes mellitus with other circulatory complications: Secondary | ICD-10-CM | POA: Diagnosis not present

## 2013-07-16 DIAGNOSIS — I872 Venous insufficiency (chronic) (peripheral): Secondary | ICD-10-CM | POA: Diagnosis not present

## 2013-07-16 DIAGNOSIS — I890 Lymphedema, not elsewhere classified: Secondary | ICD-10-CM | POA: Diagnosis not present

## 2013-07-16 DIAGNOSIS — I798 Other disorders of arteries, arterioles and capillaries in diseases classified elsewhere: Secondary | ICD-10-CM | POA: Diagnosis not present

## 2013-07-16 DIAGNOSIS — E1159 Type 2 diabetes mellitus with other circulatory complications: Secondary | ICD-10-CM | POA: Diagnosis not present

## 2013-07-16 DIAGNOSIS — I129 Hypertensive chronic kidney disease with stage 1 through stage 4 chronic kidney disease, or unspecified chronic kidney disease: Secondary | ICD-10-CM | POA: Diagnosis not present

## 2013-07-16 DIAGNOSIS — N189 Chronic kidney disease, unspecified: Secondary | ICD-10-CM | POA: Diagnosis not present

## 2013-07-20 DIAGNOSIS — I890 Lymphedema, not elsewhere classified: Secondary | ICD-10-CM | POA: Diagnosis not present

## 2013-07-20 DIAGNOSIS — I798 Other disorders of arteries, arterioles and capillaries in diseases classified elsewhere: Secondary | ICD-10-CM | POA: Diagnosis not present

## 2013-07-20 DIAGNOSIS — I129 Hypertensive chronic kidney disease with stage 1 through stage 4 chronic kidney disease, or unspecified chronic kidney disease: Secondary | ICD-10-CM | POA: Diagnosis not present

## 2013-07-20 DIAGNOSIS — N189 Chronic kidney disease, unspecified: Secondary | ICD-10-CM | POA: Diagnosis not present

## 2013-07-20 DIAGNOSIS — E1159 Type 2 diabetes mellitus with other circulatory complications: Secondary | ICD-10-CM | POA: Diagnosis not present

## 2013-07-20 DIAGNOSIS — I872 Venous insufficiency (chronic) (peripheral): Secondary | ICD-10-CM | POA: Diagnosis not present

## 2013-07-23 DIAGNOSIS — E1159 Type 2 diabetes mellitus with other circulatory complications: Secondary | ICD-10-CM | POA: Diagnosis not present

## 2013-07-23 DIAGNOSIS — I872 Venous insufficiency (chronic) (peripheral): Secondary | ICD-10-CM | POA: Diagnosis not present

## 2013-07-23 DIAGNOSIS — I890 Lymphedema, not elsewhere classified: Secondary | ICD-10-CM | POA: Diagnosis not present

## 2013-07-23 DIAGNOSIS — N189 Chronic kidney disease, unspecified: Secondary | ICD-10-CM | POA: Diagnosis not present

## 2013-07-23 DIAGNOSIS — I129 Hypertensive chronic kidney disease with stage 1 through stage 4 chronic kidney disease, or unspecified chronic kidney disease: Secondary | ICD-10-CM | POA: Diagnosis not present

## 2013-07-23 DIAGNOSIS — I798 Other disorders of arteries, arterioles and capillaries in diseases classified elsewhere: Secondary | ICD-10-CM | POA: Diagnosis not present

## 2013-07-26 DIAGNOSIS — N400 Enlarged prostate without lower urinary tract symptoms: Secondary | ICD-10-CM | POA: Diagnosis not present

## 2013-07-26 DIAGNOSIS — I4891 Unspecified atrial fibrillation: Secondary | ICD-10-CM | POA: Diagnosis not present

## 2013-07-26 DIAGNOSIS — E785 Hyperlipidemia, unspecified: Secondary | ICD-10-CM | POA: Diagnosis not present

## 2013-07-26 DIAGNOSIS — E119 Type 2 diabetes mellitus without complications: Secondary | ICD-10-CM | POA: Diagnosis not present

## 2013-07-26 DIAGNOSIS — I10 Essential (primary) hypertension: Secondary | ICD-10-CM | POA: Diagnosis not present

## 2013-07-26 DIAGNOSIS — I872 Venous insufficiency (chronic) (peripheral): Secondary | ICD-10-CM | POA: Diagnosis not present

## 2013-07-26 DIAGNOSIS — Z1212 Encounter for screening for malignant neoplasm of rectum: Secondary | ICD-10-CM | POA: Diagnosis not present

## 2013-07-26 DIAGNOSIS — Z79899 Other long term (current) drug therapy: Secondary | ICD-10-CM | POA: Diagnosis not present

## 2013-07-27 DIAGNOSIS — I798 Other disorders of arteries, arterioles and capillaries in diseases classified elsewhere: Secondary | ICD-10-CM | POA: Diagnosis not present

## 2013-07-27 DIAGNOSIS — I872 Venous insufficiency (chronic) (peripheral): Secondary | ICD-10-CM | POA: Diagnosis not present

## 2013-07-27 DIAGNOSIS — I129 Hypertensive chronic kidney disease with stage 1 through stage 4 chronic kidney disease, or unspecified chronic kidney disease: Secondary | ICD-10-CM | POA: Diagnosis not present

## 2013-07-27 DIAGNOSIS — E1159 Type 2 diabetes mellitus with other circulatory complications: Secondary | ICD-10-CM | POA: Diagnosis not present

## 2013-07-27 DIAGNOSIS — I890 Lymphedema, not elsewhere classified: Secondary | ICD-10-CM | POA: Diagnosis not present

## 2013-07-27 DIAGNOSIS — N189 Chronic kidney disease, unspecified: Secondary | ICD-10-CM | POA: Diagnosis not present

## 2013-07-30 DIAGNOSIS — I890 Lymphedema, not elsewhere classified: Secondary | ICD-10-CM | POA: Diagnosis not present

## 2013-07-30 DIAGNOSIS — N189 Chronic kidney disease, unspecified: Secondary | ICD-10-CM | POA: Diagnosis not present

## 2013-07-30 DIAGNOSIS — E1159 Type 2 diabetes mellitus with other circulatory complications: Secondary | ICD-10-CM | POA: Diagnosis not present

## 2013-07-30 DIAGNOSIS — I872 Venous insufficiency (chronic) (peripheral): Secondary | ICD-10-CM | POA: Diagnosis not present

## 2013-07-30 DIAGNOSIS — I129 Hypertensive chronic kidney disease with stage 1 through stage 4 chronic kidney disease, or unspecified chronic kidney disease: Secondary | ICD-10-CM | POA: Diagnosis not present

## 2013-07-30 DIAGNOSIS — I798 Other disorders of arteries, arterioles and capillaries in diseases classified elsewhere: Secondary | ICD-10-CM | POA: Diagnosis not present

## 2013-08-02 DIAGNOSIS — N189 Chronic kidney disease, unspecified: Secondary | ICD-10-CM | POA: Diagnosis not present

## 2013-08-02 DIAGNOSIS — E1159 Type 2 diabetes mellitus with other circulatory complications: Secondary | ICD-10-CM | POA: Diagnosis not present

## 2013-08-02 DIAGNOSIS — I872 Venous insufficiency (chronic) (peripheral): Secondary | ICD-10-CM | POA: Diagnosis not present

## 2013-08-02 DIAGNOSIS — I798 Other disorders of arteries, arterioles and capillaries in diseases classified elsewhere: Secondary | ICD-10-CM | POA: Diagnosis not present

## 2013-08-02 DIAGNOSIS — I890 Lymphedema, not elsewhere classified: Secondary | ICD-10-CM | POA: Diagnosis not present

## 2013-08-02 DIAGNOSIS — I129 Hypertensive chronic kidney disease with stage 1 through stage 4 chronic kidney disease, or unspecified chronic kidney disease: Secondary | ICD-10-CM | POA: Diagnosis not present

## 2013-08-06 DIAGNOSIS — I872 Venous insufficiency (chronic) (peripheral): Secondary | ICD-10-CM | POA: Diagnosis not present

## 2013-08-06 DIAGNOSIS — I890 Lymphedema, not elsewhere classified: Secondary | ICD-10-CM | POA: Diagnosis not present

## 2013-08-06 DIAGNOSIS — I129 Hypertensive chronic kidney disease with stage 1 through stage 4 chronic kidney disease, or unspecified chronic kidney disease: Secondary | ICD-10-CM | POA: Diagnosis not present

## 2013-08-06 DIAGNOSIS — E1159 Type 2 diabetes mellitus with other circulatory complications: Secondary | ICD-10-CM | POA: Diagnosis not present

## 2013-08-06 DIAGNOSIS — N189 Chronic kidney disease, unspecified: Secondary | ICD-10-CM | POA: Diagnosis not present

## 2013-08-06 DIAGNOSIS — I798 Other disorders of arteries, arterioles and capillaries in diseases classified elsewhere: Secondary | ICD-10-CM | POA: Diagnosis not present

## 2013-08-09 DIAGNOSIS — E1159 Type 2 diabetes mellitus with other circulatory complications: Secondary | ICD-10-CM | POA: Diagnosis not present

## 2013-08-09 DIAGNOSIS — I129 Hypertensive chronic kidney disease with stage 1 through stage 4 chronic kidney disease, or unspecified chronic kidney disease: Secondary | ICD-10-CM | POA: Diagnosis not present

## 2013-08-09 DIAGNOSIS — I872 Venous insufficiency (chronic) (peripheral): Secondary | ICD-10-CM | POA: Diagnosis not present

## 2013-08-09 DIAGNOSIS — I890 Lymphedema, not elsewhere classified: Secondary | ICD-10-CM | POA: Diagnosis not present

## 2013-08-09 DIAGNOSIS — N189 Chronic kidney disease, unspecified: Secondary | ICD-10-CM | POA: Diagnosis not present

## 2013-08-09 DIAGNOSIS — I798 Other disorders of arteries, arterioles and capillaries in diseases classified elsewhere: Secondary | ICD-10-CM | POA: Diagnosis not present

## 2013-08-12 DIAGNOSIS — Z4800 Encounter for change or removal of nonsurgical wound dressing: Secondary | ICD-10-CM | POA: Diagnosis not present

## 2013-08-12 DIAGNOSIS — E1159 Type 2 diabetes mellitus with other circulatory complications: Secondary | ICD-10-CM | POA: Diagnosis not present

## 2013-08-12 DIAGNOSIS — I890 Lymphedema, not elsewhere classified: Secondary | ICD-10-CM | POA: Diagnosis not present

## 2013-08-12 DIAGNOSIS — M1710 Unilateral primary osteoarthritis, unspecified knee: Secondary | ICD-10-CM | POA: Diagnosis not present

## 2013-08-12 DIAGNOSIS — Z9181 History of falling: Secondary | ICD-10-CM | POA: Diagnosis not present

## 2013-08-12 DIAGNOSIS — I872 Venous insufficiency (chronic) (peripheral): Secondary | ICD-10-CM | POA: Diagnosis not present

## 2013-08-12 DIAGNOSIS — Z794 Long term (current) use of insulin: Secondary | ICD-10-CM | POA: Diagnosis not present

## 2013-08-12 DIAGNOSIS — N189 Chronic kidney disease, unspecified: Secondary | ICD-10-CM | POA: Diagnosis not present

## 2013-08-12 DIAGNOSIS — I798 Other disorders of arteries, arterioles and capillaries in diseases classified elsewhere: Secondary | ICD-10-CM | POA: Diagnosis not present

## 2013-08-12 DIAGNOSIS — I129 Hypertensive chronic kidney disease with stage 1 through stage 4 chronic kidney disease, or unspecified chronic kidney disease: Secondary | ICD-10-CM | POA: Diagnosis not present

## 2013-08-13 DIAGNOSIS — I872 Venous insufficiency (chronic) (peripheral): Secondary | ICD-10-CM | POA: Diagnosis not present

## 2013-08-13 DIAGNOSIS — E1159 Type 2 diabetes mellitus with other circulatory complications: Secondary | ICD-10-CM | POA: Diagnosis not present

## 2013-08-13 DIAGNOSIS — N189 Chronic kidney disease, unspecified: Secondary | ICD-10-CM | POA: Diagnosis not present

## 2013-08-13 DIAGNOSIS — I798 Other disorders of arteries, arterioles and capillaries in diseases classified elsewhere: Secondary | ICD-10-CM | POA: Diagnosis not present

## 2013-08-13 DIAGNOSIS — I129 Hypertensive chronic kidney disease with stage 1 through stage 4 chronic kidney disease, or unspecified chronic kidney disease: Secondary | ICD-10-CM | POA: Diagnosis not present

## 2013-08-13 DIAGNOSIS — I890 Lymphedema, not elsewhere classified: Secondary | ICD-10-CM | POA: Diagnosis not present

## 2013-08-16 DIAGNOSIS — I890 Lymphedema, not elsewhere classified: Secondary | ICD-10-CM | POA: Diagnosis not present

## 2013-08-16 DIAGNOSIS — I872 Venous insufficiency (chronic) (peripheral): Secondary | ICD-10-CM | POA: Diagnosis not present

## 2013-08-16 DIAGNOSIS — I798 Other disorders of arteries, arterioles and capillaries in diseases classified elsewhere: Secondary | ICD-10-CM | POA: Diagnosis not present

## 2013-08-16 DIAGNOSIS — N189 Chronic kidney disease, unspecified: Secondary | ICD-10-CM | POA: Diagnosis not present

## 2013-08-16 DIAGNOSIS — E1159 Type 2 diabetes mellitus with other circulatory complications: Secondary | ICD-10-CM | POA: Diagnosis not present

## 2013-08-16 DIAGNOSIS — I129 Hypertensive chronic kidney disease with stage 1 through stage 4 chronic kidney disease, or unspecified chronic kidney disease: Secondary | ICD-10-CM | POA: Diagnosis not present

## 2013-08-20 DIAGNOSIS — N189 Chronic kidney disease, unspecified: Secondary | ICD-10-CM | POA: Diagnosis not present

## 2013-08-20 DIAGNOSIS — I872 Venous insufficiency (chronic) (peripheral): Secondary | ICD-10-CM | POA: Diagnosis not present

## 2013-08-20 DIAGNOSIS — I798 Other disorders of arteries, arterioles and capillaries in diseases classified elsewhere: Secondary | ICD-10-CM | POA: Diagnosis not present

## 2013-08-20 DIAGNOSIS — E1159 Type 2 diabetes mellitus with other circulatory complications: Secondary | ICD-10-CM | POA: Diagnosis not present

## 2013-08-20 DIAGNOSIS — I129 Hypertensive chronic kidney disease with stage 1 through stage 4 chronic kidney disease, or unspecified chronic kidney disease: Secondary | ICD-10-CM | POA: Diagnosis not present

## 2013-08-20 DIAGNOSIS — I890 Lymphedema, not elsewhere classified: Secondary | ICD-10-CM | POA: Diagnosis not present

## 2013-08-23 DIAGNOSIS — I872 Venous insufficiency (chronic) (peripheral): Secondary | ICD-10-CM | POA: Diagnosis not present

## 2013-08-23 DIAGNOSIS — I798 Other disorders of arteries, arterioles and capillaries in diseases classified elsewhere: Secondary | ICD-10-CM | POA: Diagnosis not present

## 2013-08-23 DIAGNOSIS — I890 Lymphedema, not elsewhere classified: Secondary | ICD-10-CM | POA: Diagnosis not present

## 2013-08-23 DIAGNOSIS — E1159 Type 2 diabetes mellitus with other circulatory complications: Secondary | ICD-10-CM | POA: Diagnosis not present

## 2013-08-23 DIAGNOSIS — I129 Hypertensive chronic kidney disease with stage 1 through stage 4 chronic kidney disease, or unspecified chronic kidney disease: Secondary | ICD-10-CM | POA: Diagnosis not present

## 2013-08-23 DIAGNOSIS — N189 Chronic kidney disease, unspecified: Secondary | ICD-10-CM | POA: Diagnosis not present

## 2013-08-28 DIAGNOSIS — I129 Hypertensive chronic kidney disease with stage 1 through stage 4 chronic kidney disease, or unspecified chronic kidney disease: Secondary | ICD-10-CM | POA: Diagnosis not present

## 2013-08-28 DIAGNOSIS — E1159 Type 2 diabetes mellitus with other circulatory complications: Secondary | ICD-10-CM | POA: Diagnosis not present

## 2013-08-28 DIAGNOSIS — I890 Lymphedema, not elsewhere classified: Secondary | ICD-10-CM | POA: Diagnosis not present

## 2013-08-28 DIAGNOSIS — I872 Venous insufficiency (chronic) (peripheral): Secondary | ICD-10-CM | POA: Diagnosis not present

## 2013-08-28 DIAGNOSIS — I798 Other disorders of arteries, arterioles and capillaries in diseases classified elsewhere: Secondary | ICD-10-CM | POA: Diagnosis not present

## 2013-08-28 DIAGNOSIS — N189 Chronic kidney disease, unspecified: Secondary | ICD-10-CM | POA: Diagnosis not present

## 2013-09-03 DIAGNOSIS — I890 Lymphedema, not elsewhere classified: Secondary | ICD-10-CM | POA: Diagnosis not present

## 2013-09-03 DIAGNOSIS — I798 Other disorders of arteries, arterioles and capillaries in diseases classified elsewhere: Secondary | ICD-10-CM | POA: Diagnosis not present

## 2013-09-03 DIAGNOSIS — I129 Hypertensive chronic kidney disease with stage 1 through stage 4 chronic kidney disease, or unspecified chronic kidney disease: Secondary | ICD-10-CM | POA: Diagnosis not present

## 2013-09-03 DIAGNOSIS — I872 Venous insufficiency (chronic) (peripheral): Secondary | ICD-10-CM | POA: Diagnosis not present

## 2013-09-03 DIAGNOSIS — E1159 Type 2 diabetes mellitus with other circulatory complications: Secondary | ICD-10-CM | POA: Diagnosis not present

## 2013-09-03 DIAGNOSIS — N189 Chronic kidney disease, unspecified: Secondary | ICD-10-CM | POA: Diagnosis not present

## 2013-09-12 DIAGNOSIS — E1159 Type 2 diabetes mellitus with other circulatory complications: Secondary | ICD-10-CM | POA: Diagnosis not present

## 2013-09-12 DIAGNOSIS — I798 Other disorders of arteries, arterioles and capillaries in diseases classified elsewhere: Secondary | ICD-10-CM | POA: Diagnosis not present

## 2013-09-12 DIAGNOSIS — I872 Venous insufficiency (chronic) (peripheral): Secondary | ICD-10-CM | POA: Diagnosis not present

## 2013-09-12 DIAGNOSIS — I890 Lymphedema, not elsewhere classified: Secondary | ICD-10-CM | POA: Diagnosis not present

## 2013-09-12 DIAGNOSIS — I129 Hypertensive chronic kidney disease with stage 1 through stage 4 chronic kidney disease, or unspecified chronic kidney disease: Secondary | ICD-10-CM | POA: Diagnosis not present

## 2013-09-12 DIAGNOSIS — N189 Chronic kidney disease, unspecified: Secondary | ICD-10-CM | POA: Diagnosis not present

## 2013-09-18 DIAGNOSIS — E1159 Type 2 diabetes mellitus with other circulatory complications: Secondary | ICD-10-CM | POA: Diagnosis not present

## 2013-09-18 DIAGNOSIS — I872 Venous insufficiency (chronic) (peripheral): Secondary | ICD-10-CM | POA: Diagnosis not present

## 2013-09-18 DIAGNOSIS — I890 Lymphedema, not elsewhere classified: Secondary | ICD-10-CM | POA: Diagnosis not present

## 2013-09-18 DIAGNOSIS — N189 Chronic kidney disease, unspecified: Secondary | ICD-10-CM | POA: Diagnosis not present

## 2013-09-18 DIAGNOSIS — I129 Hypertensive chronic kidney disease with stage 1 through stage 4 chronic kidney disease, or unspecified chronic kidney disease: Secondary | ICD-10-CM | POA: Diagnosis not present

## 2013-09-18 DIAGNOSIS — I798 Other disorders of arteries, arterioles and capillaries in diseases classified elsewhere: Secondary | ICD-10-CM | POA: Diagnosis not present

## 2013-09-21 DIAGNOSIS — Z23 Encounter for immunization: Secondary | ICD-10-CM | POA: Diagnosis not present

## 2013-09-25 DIAGNOSIS — N189 Chronic kidney disease, unspecified: Secondary | ICD-10-CM | POA: Diagnosis not present

## 2013-09-25 DIAGNOSIS — E1159 Type 2 diabetes mellitus with other circulatory complications: Secondary | ICD-10-CM | POA: Diagnosis not present

## 2013-09-25 DIAGNOSIS — I872 Venous insufficiency (chronic) (peripheral): Secondary | ICD-10-CM | POA: Diagnosis not present

## 2013-09-25 DIAGNOSIS — I798 Other disorders of arteries, arterioles and capillaries in diseases classified elsewhere: Secondary | ICD-10-CM | POA: Diagnosis not present

## 2013-09-25 DIAGNOSIS — I890 Lymphedema, not elsewhere classified: Secondary | ICD-10-CM | POA: Diagnosis not present

## 2013-09-25 DIAGNOSIS — I129 Hypertensive chronic kidney disease with stage 1 through stage 4 chronic kidney disease, or unspecified chronic kidney disease: Secondary | ICD-10-CM | POA: Diagnosis not present

## 2013-10-11 DIAGNOSIS — X58XXXA Exposure to other specified factors, initial encounter: Secondary | ICD-10-CM | POA: Diagnosis not present

## 2013-10-11 DIAGNOSIS — S61412A Laceration without foreign body of left hand, initial encounter: Secondary | ICD-10-CM | POA: Diagnosis not present

## 2013-10-11 DIAGNOSIS — S61207A Unspecified open wound of left little finger without damage to nail, initial encounter: Secondary | ICD-10-CM | POA: Diagnosis not present

## 2013-10-11 DIAGNOSIS — Z23 Encounter for immunization: Secondary | ICD-10-CM | POA: Diagnosis not present

## 2013-10-30 DIAGNOSIS — D649 Anemia, unspecified: Secondary | ICD-10-CM | POA: Diagnosis not present

## 2013-10-30 DIAGNOSIS — E119 Type 2 diabetes mellitus without complications: Secondary | ICD-10-CM | POA: Diagnosis not present

## 2013-10-30 DIAGNOSIS — E1121 Type 2 diabetes mellitus with diabetic nephropathy: Secondary | ICD-10-CM | POA: Diagnosis not present

## 2013-10-30 DIAGNOSIS — N183 Chronic kidney disease, stage 3 (moderate): Secondary | ICD-10-CM | POA: Diagnosis not present

## 2013-11-22 DIAGNOSIS — K648 Other hemorrhoids: Secondary | ICD-10-CM | POA: Diagnosis not present

## 2013-11-22 DIAGNOSIS — D649 Anemia, unspecified: Secondary | ICD-10-CM | POA: Diagnosis not present

## 2013-11-22 DIAGNOSIS — K219 Gastro-esophageal reflux disease without esophagitis: Secondary | ICD-10-CM | POA: Diagnosis not present

## 2013-12-18 DIAGNOSIS — I509 Heart failure, unspecified: Secondary | ICD-10-CM | POA: Diagnosis not present

## 2013-12-18 DIAGNOSIS — Z8719 Personal history of other diseases of the digestive system: Secondary | ICD-10-CM | POA: Diagnosis not present

## 2013-12-18 DIAGNOSIS — K649 Unspecified hemorrhoids: Secondary | ICD-10-CM | POA: Diagnosis not present

## 2013-12-18 DIAGNOSIS — R195 Other fecal abnormalities: Secondary | ICD-10-CM | POA: Diagnosis not present

## 2013-12-18 DIAGNOSIS — Z79899 Other long term (current) drug therapy: Secondary | ICD-10-CM | POA: Diagnosis not present

## 2013-12-18 DIAGNOSIS — E119 Type 2 diabetes mellitus without complications: Secondary | ICD-10-CM | POA: Diagnosis not present

## 2013-12-18 DIAGNOSIS — K219 Gastro-esophageal reflux disease without esophagitis: Secondary | ICD-10-CM | POA: Diagnosis not present

## 2013-12-18 DIAGNOSIS — I10 Essential (primary) hypertension: Secondary | ICD-10-CM | POA: Diagnosis not present

## 2013-12-18 DIAGNOSIS — Z7982 Long term (current) use of aspirin: Secondary | ICD-10-CM | POA: Diagnosis not present

## 2013-12-18 DIAGNOSIS — I4891 Unspecified atrial fibrillation: Secondary | ICD-10-CM | POA: Diagnosis not present

## 2013-12-18 DIAGNOSIS — K2970 Gastritis, unspecified, without bleeding: Secondary | ICD-10-CM | POA: Diagnosis not present

## 2013-12-18 DIAGNOSIS — R634 Abnormal weight loss: Secondary | ICD-10-CM | POA: Diagnosis not present

## 2013-12-18 DIAGNOSIS — D649 Anemia, unspecified: Secondary | ICD-10-CM | POA: Diagnosis not present

## 2014-01-31 DIAGNOSIS — N183 Chronic kidney disease, stage 3 (moderate): Secondary | ICD-10-CM | POA: Diagnosis not present

## 2014-01-31 DIAGNOSIS — E785 Hyperlipidemia, unspecified: Secondary | ICD-10-CM | POA: Diagnosis not present

## 2014-01-31 DIAGNOSIS — E1121 Type 2 diabetes mellitus with diabetic nephropathy: Secondary | ICD-10-CM | POA: Diagnosis not present

## 2014-01-31 DIAGNOSIS — I10 Essential (primary) hypertension: Secondary | ICD-10-CM | POA: Diagnosis not present

## 2014-01-31 DIAGNOSIS — D649 Anemia, unspecified: Secondary | ICD-10-CM | POA: Diagnosis not present

## 2014-02-20 DIAGNOSIS — K219 Gastro-esophageal reflux disease without esophagitis: Secondary | ICD-10-CM | POA: Diagnosis not present

## 2014-02-20 DIAGNOSIS — D649 Anemia, unspecified: Secondary | ICD-10-CM | POA: Diagnosis not present

## 2014-04-17 DIAGNOSIS — M25532 Pain in left wrist: Secondary | ICD-10-CM | POA: Diagnosis not present

## 2014-05-02 DIAGNOSIS — M109 Gout, unspecified: Secondary | ICD-10-CM | POA: Diagnosis not present

## 2014-05-10 DIAGNOSIS — M25572 Pain in left ankle and joints of left foot: Secondary | ICD-10-CM | POA: Diagnosis not present

## 2014-05-10 DIAGNOSIS — M79672 Pain in left foot: Secondary | ICD-10-CM | POA: Diagnosis not present

## 2014-05-10 DIAGNOSIS — M109 Gout, unspecified: Secondary | ICD-10-CM | POA: Diagnosis not present

## 2015-06-05 DIAGNOSIS — D649 Anemia, unspecified: Secondary | ICD-10-CM | POA: Diagnosis not present

## 2015-06-09 DIAGNOSIS — E1121 Type 2 diabetes mellitus with diabetic nephropathy: Secondary | ICD-10-CM | POA: Diagnosis not present

## 2015-06-09 DIAGNOSIS — I10 Essential (primary) hypertension: Secondary | ICD-10-CM | POA: Diagnosis not present

## 2015-06-09 DIAGNOSIS — E119 Type 2 diabetes mellitus without complications: Secondary | ICD-10-CM | POA: Diagnosis not present

## 2015-06-09 DIAGNOSIS — N183 Chronic kidney disease, stage 3 (moderate): Secondary | ICD-10-CM | POA: Diagnosis not present

## 2015-09-11 DIAGNOSIS — E119 Type 2 diabetes mellitus without complications: Secondary | ICD-10-CM | POA: Diagnosis not present

## 2015-09-11 DIAGNOSIS — E7800 Pure hypercholesterolemia, unspecified: Secondary | ICD-10-CM | POA: Diagnosis not present

## 2015-09-11 DIAGNOSIS — I10 Essential (primary) hypertension: Secondary | ICD-10-CM | POA: Diagnosis not present

## 2015-09-11 DIAGNOSIS — Z23 Encounter for immunization: Secondary | ICD-10-CM | POA: Diagnosis not present

## 2015-12-18 DIAGNOSIS — E119 Type 2 diabetes mellitus without complications: Secondary | ICD-10-CM | POA: Diagnosis not present

## 2015-12-18 DIAGNOSIS — Z1212 Encounter for screening for malignant neoplasm of rectum: Secondary | ICD-10-CM | POA: Diagnosis not present

## 2015-12-18 DIAGNOSIS — N400 Enlarged prostate without lower urinary tract symptoms: Secondary | ICD-10-CM | POA: Diagnosis not present

## 2015-12-18 DIAGNOSIS — I509 Heart failure, unspecified: Secondary | ICD-10-CM | POA: Diagnosis not present

## 2015-12-18 DIAGNOSIS — I10 Essential (primary) hypertension: Secondary | ICD-10-CM | POA: Diagnosis not present

## 2015-12-18 DIAGNOSIS — Z1389 Encounter for screening for other disorder: Secondary | ICD-10-CM | POA: Diagnosis not present

## 2015-12-18 DIAGNOSIS — Z0000 Encounter for general adult medical examination without abnormal findings: Secondary | ICD-10-CM | POA: Diagnosis not present

## 2015-12-18 DIAGNOSIS — I4891 Unspecified atrial fibrillation: Secondary | ICD-10-CM | POA: Diagnosis not present

## 2016-03-18 DIAGNOSIS — E119 Type 2 diabetes mellitus without complications: Secondary | ICD-10-CM | POA: Diagnosis not present

## 2016-05-12 DIAGNOSIS — S61012A Laceration without foreign body of left thumb without damage to nail, initial encounter: Secondary | ICD-10-CM | POA: Diagnosis not present

## 2016-05-21 DIAGNOSIS — S61012D Laceration without foreign body of left thumb without damage to nail, subsequent encounter: Secondary | ICD-10-CM | POA: Diagnosis not present

## 2016-06-03 DIAGNOSIS — E119 Type 2 diabetes mellitus without complications: Secondary | ICD-10-CM | POA: Diagnosis not present

## 2016-06-21 DIAGNOSIS — E119 Type 2 diabetes mellitus without complications: Secondary | ICD-10-CM | POA: Diagnosis not present

## 2016-07-01 DIAGNOSIS — R5381 Other malaise: Secondary | ICD-10-CM | POA: Diagnosis not present

## 2016-07-01 DIAGNOSIS — R591 Generalized enlarged lymph nodes: Secondary | ICD-10-CM | POA: Diagnosis not present

## 2016-07-01 DIAGNOSIS — M109 Gout, unspecified: Secondary | ICD-10-CM | POA: Diagnosis not present

## 2016-07-01 DIAGNOSIS — K591 Functional diarrhea: Secondary | ICD-10-CM | POA: Diagnosis not present

## 2016-07-02 DIAGNOSIS — N281 Cyst of kidney, acquired: Secondary | ICD-10-CM | POA: Diagnosis not present

## 2016-07-02 DIAGNOSIS — R197 Diarrhea, unspecified: Secondary | ICD-10-CM | POA: Diagnosis not present

## 2016-07-12 DIAGNOSIS — R197 Diarrhea, unspecified: Secondary | ICD-10-CM | POA: Diagnosis not present

## 2016-08-04 DIAGNOSIS — D649 Anemia, unspecified: Secondary | ICD-10-CM | POA: Diagnosis not present

## 2016-09-22 DIAGNOSIS — I10 Essential (primary) hypertension: Secondary | ICD-10-CM | POA: Diagnosis not present

## 2016-09-22 DIAGNOSIS — I4891 Unspecified atrial fibrillation: Secondary | ICD-10-CM | POA: Diagnosis not present

## 2016-09-22 DIAGNOSIS — N183 Chronic kidney disease, stage 3 (moderate): Secondary | ICD-10-CM | POA: Diagnosis not present

## 2016-09-22 DIAGNOSIS — E1121 Type 2 diabetes mellitus with diabetic nephropathy: Secondary | ICD-10-CM | POA: Diagnosis not present

## 2016-09-22 DIAGNOSIS — Z23 Encounter for immunization: Secondary | ICD-10-CM | POA: Diagnosis not present

## 2016-12-22 DIAGNOSIS — N400 Enlarged prostate without lower urinary tract symptoms: Secondary | ICD-10-CM | POA: Diagnosis not present

## 2016-12-22 DIAGNOSIS — I4891 Unspecified atrial fibrillation: Secondary | ICD-10-CM | POA: Diagnosis not present

## 2016-12-22 DIAGNOSIS — Z0000 Encounter for general adult medical examination without abnormal findings: Secondary | ICD-10-CM | POA: Diagnosis not present

## 2016-12-22 DIAGNOSIS — E119 Type 2 diabetes mellitus without complications: Secondary | ICD-10-CM | POA: Diagnosis not present

## 2016-12-22 DIAGNOSIS — I10 Essential (primary) hypertension: Secondary | ICD-10-CM | POA: Diagnosis not present

## 2016-12-22 DIAGNOSIS — E559 Vitamin D deficiency, unspecified: Secondary | ICD-10-CM | POA: Diagnosis not present

## 2016-12-22 DIAGNOSIS — M109 Gout, unspecified: Secondary | ICD-10-CM | POA: Diagnosis not present

## 2016-12-22 DIAGNOSIS — N183 Chronic kidney disease, stage 3 (moderate): Secondary | ICD-10-CM | POA: Diagnosis not present

## 2016-12-22 DIAGNOSIS — E7800 Pure hypercholesterolemia, unspecified: Secondary | ICD-10-CM | POA: Diagnosis not present

## 2016-12-22 DIAGNOSIS — Z1389 Encounter for screening for other disorder: Secondary | ICD-10-CM | POA: Diagnosis not present

## 2017-03-25 DIAGNOSIS — N183 Chronic kidney disease, stage 3 (moderate): Secondary | ICD-10-CM | POA: Diagnosis not present

## 2017-03-25 DIAGNOSIS — E1140 Type 2 diabetes mellitus with diabetic neuropathy, unspecified: Secondary | ICD-10-CM | POA: Diagnosis not present

## 2017-03-25 DIAGNOSIS — E119 Type 2 diabetes mellitus without complications: Secondary | ICD-10-CM | POA: Diagnosis not present

## 2017-06-23 DIAGNOSIS — E119 Type 2 diabetes mellitus without complications: Secondary | ICD-10-CM | POA: Diagnosis not present

## 2017-06-29 DIAGNOSIS — H5213 Myopia, bilateral: Secondary | ICD-10-CM | POA: Diagnosis not present

## 2017-07-11 DIAGNOSIS — E785 Hyperlipidemia, unspecified: Secondary | ICD-10-CM | POA: Diagnosis not present

## 2017-07-11 DIAGNOSIS — N183 Chronic kidney disease, stage 3 (moderate): Secondary | ICD-10-CM | POA: Diagnosis not present

## 2017-07-11 DIAGNOSIS — E119 Type 2 diabetes mellitus without complications: Secondary | ICD-10-CM | POA: Diagnosis not present

## 2017-07-11 DIAGNOSIS — I10 Essential (primary) hypertension: Secondary | ICD-10-CM | POA: Diagnosis not present

## 2017-07-11 DIAGNOSIS — I482 Chronic atrial fibrillation: Secondary | ICD-10-CM | POA: Diagnosis not present

## 2017-07-28 DIAGNOSIS — M79605 Pain in left leg: Secondary | ICD-10-CM | POA: Diagnosis not present

## 2017-07-28 DIAGNOSIS — W540XXA Bitten by dog, initial encounter: Secondary | ICD-10-CM | POA: Diagnosis not present

## 2017-08-31 DIAGNOSIS — K859 Acute pancreatitis without necrosis or infection, unspecified: Secondary | ICD-10-CM | POA: Diagnosis not present

## 2017-08-31 DIAGNOSIS — Z7982 Long term (current) use of aspirin: Secondary | ICD-10-CM | POA: Diagnosis not present

## 2017-08-31 DIAGNOSIS — R112 Nausea with vomiting, unspecified: Secondary | ICD-10-CM | POA: Diagnosis not present

## 2017-08-31 DIAGNOSIS — R1084 Generalized abdominal pain: Secondary | ICD-10-CM | POA: Diagnosis not present

## 2017-08-31 DIAGNOSIS — Z794 Long term (current) use of insulin: Secondary | ICD-10-CM | POA: Diagnosis not present

## 2017-08-31 DIAGNOSIS — J9811 Atelectasis: Secondary | ICD-10-CM | POA: Diagnosis not present

## 2017-08-31 DIAGNOSIS — Z792 Long term (current) use of antibiotics: Secondary | ICD-10-CM | POA: Diagnosis not present

## 2017-08-31 DIAGNOSIS — I959 Hypotension, unspecified: Secondary | ICD-10-CM | POA: Diagnosis not present

## 2017-08-31 DIAGNOSIS — Z79899 Other long term (current) drug therapy: Secondary | ICD-10-CM | POA: Diagnosis not present

## 2017-08-31 DIAGNOSIS — R52 Pain, unspecified: Secondary | ICD-10-CM | POA: Diagnosis not present

## 2017-08-31 DIAGNOSIS — K8020 Calculus of gallbladder without cholecystitis without obstruction: Secondary | ICD-10-CM | POA: Diagnosis not present

## 2017-08-31 DIAGNOSIS — Z7952 Long term (current) use of systemic steroids: Secondary | ICD-10-CM | POA: Diagnosis not present

## 2017-08-31 DIAGNOSIS — R110 Nausea: Secondary | ICD-10-CM | POA: Diagnosis not present

## 2017-08-31 DIAGNOSIS — I447 Left bundle-branch block, unspecified: Secondary | ICD-10-CM | POA: Diagnosis not present

## 2017-09-01 ENCOUNTER — Other Ambulatory Visit: Payer: Self-pay

## 2017-09-01 ENCOUNTER — Inpatient Hospital Stay (HOSPITAL_COMMUNITY)
Admission: AD | Admit: 2017-09-01 | Discharge: 2017-09-06 | DRG: 418 | Disposition: A | Discharge status: Home / Self Care | Payer: Medicare Other | Source: Other Acute Inpatient Hospital | Attending: Family Medicine | Admitting: Family Medicine

## 2017-09-01 ENCOUNTER — Encounter (HOSPITAL_COMMUNITY): Payer: Self-pay | Admitting: Internal Medicine

## 2017-09-01 DIAGNOSIS — E119 Type 2 diabetes mellitus without complications: Secondary | ICD-10-CM

## 2017-09-01 DIAGNOSIS — N281 Cyst of kidney, acquired: Secondary | ICD-10-CM | POA: Diagnosis present

## 2017-09-01 DIAGNOSIS — E785 Hyperlipidemia, unspecified: Secondary | ICD-10-CM | POA: Diagnosis present

## 2017-09-01 DIAGNOSIS — K8500 Idiopathic acute pancreatitis without necrosis or infection: Secondary | ICD-10-CM

## 2017-09-01 DIAGNOSIS — I482 Chronic atrial fibrillation, unspecified: Secondary | ICD-10-CM | POA: Diagnosis present

## 2017-09-01 DIAGNOSIS — Z419 Encounter for procedure for purposes other than remedying health state, unspecified: Secondary | ICD-10-CM

## 2017-09-01 DIAGNOSIS — E1122 Type 2 diabetes mellitus with diabetic chronic kidney disease: Secondary | ICD-10-CM | POA: Diagnosis present

## 2017-09-01 DIAGNOSIS — K8010 Calculus of gallbladder with chronic cholecystitis without obstruction: Secondary | ICD-10-CM | POA: Diagnosis present

## 2017-09-01 DIAGNOSIS — R112 Nausea with vomiting, unspecified: Secondary | ICD-10-CM | POA: Diagnosis not present

## 2017-09-01 DIAGNOSIS — Z23 Encounter for immunization: Secondary | ICD-10-CM

## 2017-09-01 DIAGNOSIS — R945 Abnormal results of liver function studies: Secondary | ICD-10-CM | POA: Diagnosis not present

## 2017-09-01 DIAGNOSIS — K859 Acute pancreatitis without necrosis or infection, unspecified: Secondary | ICD-10-CM | POA: Diagnosis present

## 2017-09-01 DIAGNOSIS — K8020 Calculus of gallbladder without cholecystitis without obstruction: Secondary | ICD-10-CM | POA: Diagnosis not present

## 2017-09-01 DIAGNOSIS — K429 Umbilical hernia without obstruction or gangrene: Secondary | ICD-10-CM | POA: Diagnosis present

## 2017-09-01 DIAGNOSIS — K915 Postcholecystectomy syndrome: Secondary | ICD-10-CM | POA: Diagnosis not present

## 2017-09-01 DIAGNOSIS — I959 Hypotension, unspecified: Secondary | ICD-10-CM | POA: Diagnosis not present

## 2017-09-01 DIAGNOSIS — I130 Hypertensive heart and chronic kidney disease with heart failure and stage 1 through stage 4 chronic kidney disease, or unspecified chronic kidney disease: Secondary | ICD-10-CM | POA: Diagnosis not present

## 2017-09-01 DIAGNOSIS — N183 Chronic kidney disease, stage 3 unspecified: Secondary | ICD-10-CM | POA: Diagnosis present

## 2017-09-01 DIAGNOSIS — Z79899 Other long term (current) drug therapy: Secondary | ICD-10-CM

## 2017-09-01 DIAGNOSIS — K851 Biliary acute pancreatitis without necrosis or infection: Secondary | ICD-10-CM | POA: Diagnosis not present

## 2017-09-01 DIAGNOSIS — N179 Acute kidney failure, unspecified: Secondary | ICD-10-CM | POA: Diagnosis present

## 2017-09-01 DIAGNOSIS — I5032 Chronic diastolic (congestive) heart failure: Secondary | ICD-10-CM | POA: Diagnosis present

## 2017-09-01 DIAGNOSIS — Z7982 Long term (current) use of aspirin: Secondary | ICD-10-CM | POA: Diagnosis not present

## 2017-09-01 DIAGNOSIS — Z794 Long term (current) use of insulin: Secondary | ICD-10-CM

## 2017-09-01 DIAGNOSIS — K219 Gastro-esophageal reflux disease without esophagitis: Secondary | ICD-10-CM | POA: Diagnosis present

## 2017-09-01 DIAGNOSIS — J9811 Atelectasis: Secondary | ICD-10-CM | POA: Diagnosis not present

## 2017-09-01 DIAGNOSIS — Z792 Long term (current) use of antibiotics: Secondary | ICD-10-CM | POA: Diagnosis not present

## 2017-09-01 DIAGNOSIS — Z7952 Long term (current) use of systemic steroids: Secondary | ICD-10-CM | POA: Diagnosis not present

## 2017-09-01 DIAGNOSIS — I480 Paroxysmal atrial fibrillation: Secondary | ICD-10-CM | POA: Diagnosis present

## 2017-09-01 HISTORY — DX: Heart failure, unspecified: I50.9

## 2017-09-01 HISTORY — DX: Hyperlipidemia, unspecified: E78.5

## 2017-09-01 HISTORY — DX: Unspecified atrial fibrillation: I48.91

## 2017-09-01 HISTORY — DX: Type 2 diabetes mellitus without complications: E11.9

## 2017-09-01 HISTORY — DX: Chronic kidney disease, unspecified: N18.9

## 2017-09-01 HISTORY — DX: Gout, unspecified: M10.9

## 2017-09-01 LAB — GLUCOSE, CAPILLARY
GLUCOSE-CAPILLARY: 137 mg/dL — AB (ref 70–99)
Glucose-Capillary: 115 mg/dL — ABNORMAL HIGH (ref 70–99)
Glucose-Capillary: 97 mg/dL (ref 70–99)

## 2017-09-01 LAB — LIPASE, BLOOD: Lipase: 952 U/L — ABNORMAL HIGH (ref 11–51)

## 2017-09-01 LAB — CBC WITH DIFFERENTIAL/PLATELET
ABS IMMATURE GRANULOCYTES: 0 10*3/uL (ref 0.0–0.1)
Basophils Absolute: 0 10*3/uL (ref 0.0–0.1)
Basophils Relative: 0 %
EOS ABS: 0.2 10*3/uL (ref 0.0–0.7)
Eosinophils Relative: 1 %
HEMATOCRIT: 34.5 % — AB (ref 39.0–52.0)
HEMOGLOBIN: 11 g/dL — AB (ref 13.0–17.0)
IMMATURE GRANULOCYTES: 0 %
LYMPHS ABS: 1.2 10*3/uL (ref 0.7–4.0)
LYMPHS PCT: 10 %
MCH: 31.5 pg (ref 26.0–34.0)
MCHC: 31.9 g/dL (ref 30.0–36.0)
MCV: 98.9 fL (ref 78.0–100.0)
MONOS PCT: 5 %
Monocytes Absolute: 0.6 10*3/uL (ref 0.1–1.0)
NEUTROS ABS: 9.9 10*3/uL — AB (ref 1.7–7.7)
NEUTROS PCT: 84 %
Platelets: 124 10*3/uL — ABNORMAL LOW (ref 150–400)
RBC: 3.49 MIL/uL — ABNORMAL LOW (ref 4.22–5.81)
RDW: 14.7 % (ref 11.5–15.5)
WBC: 12 10*3/uL — ABNORMAL HIGH (ref 4.0–10.5)

## 2017-09-01 LAB — LIPID PANEL
CHOLESTEROL: 108 mg/dL (ref 0–200)
HDL: 23 mg/dL — ABNORMAL LOW (ref >40)
LDL Cholesterol: 59 mg/dL (ref 0–99)
TRIGLYCERIDES: 128 mg/dL (ref <150)
Total CHOL/HDL Ratio: 4.7 RATIO
VLDL: 26 mg/dL (ref 0–40)

## 2017-09-01 LAB — COMPREHENSIVE METABOLIC PANEL
ALT: 105 U/L — AB (ref 0–44)
AST: 90 U/L — ABNORMAL HIGH (ref 15–41)
Albumin: 3.3 g/dL — ABNORMAL LOW (ref 3.5–5.0)
Alkaline Phosphatase: 67 U/L (ref 38–126)
Anion gap: 8 (ref 5–15)
BUN: 43 mg/dL — ABNORMAL HIGH (ref 8–23)
CHLORIDE: 112 mmol/L — AB (ref 98–111)
CO2: 22 mmol/L (ref 22–32)
Calcium: 7.7 mg/dL — ABNORMAL LOW (ref 8.9–10.3)
Creatinine, Ser: 2.5 mg/dL — ABNORMAL HIGH (ref 0.61–1.24)
GFR calc Af Amer: 28 mL/min — ABNORMAL LOW (ref >60)
GFR calc non Af Amer: 24 mL/min — ABNORMAL LOW (ref >60)
Glucose, Bld: 125 mg/dL — ABNORMAL HIGH (ref 70–99)
POTASSIUM: 4.9 mmol/L (ref 3.5–5.1)
SODIUM: 142 mmol/L (ref 135–145)
Total Bilirubin: 0.7 mg/dL (ref 0.3–1.2)
Total Protein: 6.4 g/dL — ABNORMAL LOW (ref 6.5–8.1)

## 2017-09-01 LAB — DIGOXIN LEVEL: DIGOXIN LVL: 0.7 ng/mL — AB (ref 0.8–2.0)

## 2017-09-01 LAB — HEMOGLOBIN A1C
HEMOGLOBIN A1C: 6 % — AB (ref 4.8–5.6)
MEAN PLASMA GLUCOSE: 125.5 mg/dL

## 2017-09-01 LAB — PROTIME-INR
INR: 1.25
Prothrombin Time: 15.6 seconds — ABNORMAL HIGH (ref 11.4–15.2)

## 2017-09-01 MED ORDER — INSULIN GLARGINE 100 UNIT/ML ~~LOC~~ SOLN
34.0000 [IU] | Freq: Every day | SUBCUTANEOUS | Status: DC
  Filled 2017-09-01 (×2): qty 0.34

## 2017-09-01 MED ORDER — ONDANSETRON HCL 4 MG/2ML IJ SOLN: 4.0000 mg | Freq: Four times a day (QID) | INTRAMUSCULAR | Status: DC | PRN

## 2017-09-01 MED ORDER — ACETAMINOPHEN 650 MG RE SUPP: 650.0000 mg | Freq: Four times a day (QID) | RECTAL | Status: DC | PRN

## 2017-09-01 MED ORDER — DIGOXIN 125 MCG PO TABS
125.0000 ug | ORAL_TABLET | Freq: Every day | ORAL | Status: DC
  Administered 2017-09-01 – 2017-09-06 (×5): 125 ug via ORAL
  Filled 2017-09-01 (×6): qty 1

## 2017-09-01 MED ORDER — ACETAMINOPHEN 325 MG PO TABS: 650.0000 mg | ORAL_TABLET | Freq: Four times a day (QID) | ORAL | Status: DC | PRN

## 2017-09-01 MED ORDER — INSULIN GLARGINE 100 UNIT/ML ~~LOC~~ SOLN
17.0000 [IU] | Freq: Once | SUBCUTANEOUS | Status: AC
  Administered 2017-09-01: 17 [IU] via SUBCUTANEOUS
  Filled 2017-09-01: qty 0.17

## 2017-09-01 MED ORDER — MORPHINE SULFATE (PF) 2 MG/ML IV SOLN: 2.0000 mg | INTRAVENOUS | Status: DC | PRN

## 2017-09-01 MED ORDER — LACTATED RINGERS IV SOLN
INTRAVENOUS | Status: DC
  Administered 2017-09-01 – 2017-09-05 (×8): via INTRAVENOUS

## 2017-09-01 MED ORDER — INSULIN DEGLUDEC 100 UNIT/ML ~~LOC~~ SOPN: 34.0000 [IU] | PEN_INJECTOR | Freq: Every day | SUBCUTANEOUS | Status: DC

## 2017-09-01 MED ORDER — PNEUMOCOCCAL VAC POLYVALENT 25 MCG/0.5ML IJ INJ
0.5000 mL | INJECTION | INTRAMUSCULAR | Status: AC
  Administered 2017-09-02: 0.5 mL via INTRAMUSCULAR
  Filled 2017-09-01: qty 0.5

## 2017-09-01 MED ORDER — ONDANSETRON HCL 4 MG PO TABS: 4.0000 mg | ORAL_TABLET | Freq: Four times a day (QID) | ORAL | Status: DC | PRN

## 2017-09-01 MED ORDER — INSULIN ASPART 100 UNIT/ML ~~LOC~~ SOLN: 0.0000 [IU] | Freq: Every day | SUBCUTANEOUS | Status: DC

## 2017-09-01 MED ORDER — INSULIN ASPART 100 UNIT/ML ~~LOC~~ SOLN
0.0000 [IU] | Freq: Three times a day (TID) | SUBCUTANEOUS | Status: DC
  Administered 2017-09-04 – 2017-09-05 (×3): 2 [IU] via SUBCUTANEOUS

## 2017-09-01 NOTE — Consult Note (Addendum)
[facility] Consult: 11:40 AM 09/01/2017  LOS: 0 days    Referring Provider: Dr Lorin Mercy  Primary Care Physician:  [name], [name], MD in [place] Primary Gastroenterologist:  Althia Forts transfer from [facility]    Reason for Consultation:  Gallstone pancreatitis.     HPI: Dennis Nunez is a 72 y.o. male.  History atrial fibrillation.  Not on anticoagulation.  CHF.  CKD.  IDDM.  Gout. Patient has had previous EGD and colonoscopy with Dr. Lyda Jester in [place].  His reflux disease is well controlled with omeprazole.  He has had "benign"colon polyps and has repeat colonoscopies every 5 years, his next colonoscopy is due ~09/2017. Listed home meds include Prilosec 20 mg daily, iron sulfate, no aspirin, no NSAIDs.  While at evening church services on Wednesday, around 6 PM he developed abdominal pain and bloating in the upper abdomen.  When he got home, about 9:00 he had nausea vomiting and quickly started to feel weak, dizzy, sweaty.  He fell a couple of times but did not sustain any serious trauma or hit his head.  He called 911 and was transported to [facility]. AKI, BUN/creatinine 49/2.7.  Glucose 201. Hgb 12.1.  WBC 13.7. Lipase 41,820 T bili 0.8.  Alkaline phosphatase 77.  AST/ALT 160/91 Lactic acid 2.4  Noncontrast CT abdomen pelvis 2.4 cm lesion in the dome of the liver, similar to CT of 07/02/2016.  Based on prior CT, this is likely a cyst.  Stone in the gallbladder fundus, no pericholecystic fluid or evidence for acute cholecystitis.  No intrahepatic biliary ductal dilatation.  Diffuse inflammatory changes in the pancreas consistent with acute pancreatitis.  Moderate stool burden.  Right renal cyst recommend ultrasound to further evaluate.  Small, fat-containing umbilical hernia.  Arrangements  were made to transport the patient to [facility] because there was no GI coverage at [facility].  He arrived here around 8 AM this morning. There are no fevers.  No hypotension, no tachycardia. Patient's pain has greatly improved.  He has had no further nausea or vomiting.  He is urinating well.  Currently fluids are running at 150 mL's per hour.  She does not drink alcohol.  He has not had any new medications in over a year.  About a month ago he had similar, but much less severe symptoms of upper abdominal discomfort and pain but no nausea or vomiting.  These lasted at most an hour and then resolved.  Normally he has a good appetite.  Does not suffer from nausea or vomiting.   Past Medical History:  Diagnosis Date  . Atrial fibrillation ([place])    takes Dilt but not apparently AC  . CHF (congestive heart failure) ([place])   . CKD (chronic kidney disease)   . Dyslipidemia   . Gout   . Type 2 diabetes mellitus ([place])     History reviewed. No pertinent surgical history.  Prior to Admission medications   Not on File    Scheduled Meds: . insulin aspart  0-15 Units Subcutaneous TID WC  . insulin aspart  0-5 Units Subcutaneous QHS   Infusions: . lactated ringers     PRN Meds: acetaminophen **OR** acetaminophen, morphine injection, ondansetron **OR** ondansetron (ZOFRAN) IV   Allergies as of 09/01/2017 - Review Complete 09/01/2017  Allergen Reaction Noted  . Penicillins Hives 09/01/2017    History reviewed. No pertinent family history.  Social History   Socioeconomic History  . Marital status: Single    Spouse name: Not on file  . Number of children: Not on file  . Years of education: Not on file  . Highest education level: Not on file  Occupational History  . 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 Financial resource strain: Not on file  . Food insecurity:    Worry: Not on file    Inability: Not on file  .  Transportation needs:    Medical: Not on file    Non-medical: Not on file  Tobacco Use  . Smoking status: Never Smoker  . Smokeless tobacco: Never Used  Substance and Sexual Activity  . Alcohol use: Never    Frequency: Never  . Drug use: Never  . Sexual activity: Not on file  Lifestyle  . Physical activity:    Days per week: Not on file    Minutes per session: Not on file  . Stress: Not on file  Relationships  . Social connections:    Talks on phone: Not on file    Gets together: Not on file    Attends religious service: Not on file    Active member of club or organization: Not on file    Attends meetings of clubs or organizations: Not on file    Relationship status: Not on file  . Intimate partner violence:    Fear of current or ex partner: Not on file    Emotionally abused: Not on file    Physically abused: Not on file    Forced sexual activity: Not on file  Other Topics Concern  . Not on file  Social History Narrative  . Not on file    REVIEW OF SYSTEMS: Constitutional: Until he got sick the other day, he has good energy level no fatigue. ENT:  No nose bleeds Pulm: No DOE unless he climbs a lot of stairs or tries to run any distance.  No cough. CV:  No palpitations, no LE edema.  GU:  No hematuria, no frequency GI:  Per HPI Heme: No unusual bleeding or bruising. Transfusions: None Neuro:  No headaches, no peripheral tingling or numbness Derm:  No itching, no rash or sores.  Endocrine:  No sweats or chills.  No polyuria or dysuria Immunization: Not queried. Travel:  None beyond local counties in last few months.    PHYSICAL EXAM: Vital signs in last 24 hours: Vitals:   09/01/17 0957  BP: (!) 128/56  Pulse: (!) 50  Resp: 19  Temp: 98 F (36.7 C)  SpO2: 97%   Wt Readings from Last 3 Encounters:  09/01/17 126.4 kg    General: Obese, pleasant, comfortable, intelligent, alert, non-ill-appearing WM Head: No facial asymmetry or swelling.  No signs of head  trauma. Eyes: No scleral icterus.  No conjunctival pallor. Ears: Not hard of hearing Nose: No congestion or discharge Mouth: Oral mucosa is pink, moist, clear.  Tongue midline.  Several missing teeth.  Overall dental health is moderately poor Neck: No JVD, no masses, no thyromegaly Lungs: Clear bilaterally.  No labored breathing  or cough. Heart: RRR.  No MRG.  S1, S2 present. Abdomen: Obese but soft.  Bowel sounds normal but hypoactive.  No tinkling or tympanitic bowel sounds.  There is tenderness throughout the abdomen but it is more pronounced in the upper abdomen.  No guarding or rebound..   Rectal: Deferred Musc/Skeltl: No joint redness, swelling or significant deformity Extremities: Nonpitting pedal edema. Neurologic: Alert.  Oriented x3.  Excellent detailed historian.  Moves all 4 limbs.  Note tremor, no limb weakness. Skin: No rashes, no sores, no jaundice. Tattoos: None Nodes: No cervical or inguinal adenopathy. Psych: Cooperative, pleasant, fluid speech.  Intake/Output from previous day: No intake/output data recorded. Intake/Output this shift: No intake/output data recorded.  LAB RESULTS: No results for input(s): WBC, HGB, HCT, PLT in the last 72 hours. BMET No results found for: NA, K, CL, CO2, GLUCOSE, BUN, CREATININE, CALCIUM LFT No results for input(s): PROT, ALBUMIN, AST, ALT, ALKPHOS, BILITOT, BILIDIR, IBILI in the last 72 hours. PT/INR No results found for: INR, PROTIME Hepatitis Panel No results for input(s): HEPBSAG, HCVAB, HEPAIGM, HEPBIGM in the last 72 hours. C-Diff No components found for: CDIFF Lipase  No results found for: LIPASE  Drugs of Abuse  No results found for: LABOPIA, COCAINSCRNUR, LABBENZ, AMPHETMU, THCU, LABBARB   RADIOLOGY STUDIES: No results found.     IMPRESSION:   *    Acute pancreatitis.  Suspect biliary.  CT scan does not indicate any biliary ductal dilatation or CBD stone.  *    Cholelithiasis without evidence for acute  cholecystitis.  *   History of benign colon polyps.  Patient is due repeat screening colonoscopy this fall, 2019.  *   GERD.  Symptoms well controlled with omeprazole.  *    IDDM.    PLAN:     *   Patient does not need MRCP or ERCP (Endoscopicretrogradecholangiopancreatography).  *   Patient needs consultation with general surgery regarding cholecystectomy.  *    Repeat lipase, BMET and LFTs tomorrow morning  Azucena Freed  09/01/2017, 11:40 AM Phone [phone]  GI ATTENDING  History, laboratories, x-rays reviewed. Patient personally seen and examined. Agree with comprehensive consultation note as outlined above. The patient presents with acute pancreatitis. The clinical, biochemical, and radiologic picture most consistent with biliary pancreatitis without ductal obstruction. Presumably has passed gallstone. Feeling much better today. Some tenderness but looks well. Agree with surgical consultation for sooner rather than later microscopic cholecystectomy with intraoperative cholangiogram. Trend LFTs. I discussed this with the patient.  Docia Chuck. Geri Seminole., M.D. [facility] Division of Gastroenterology

## 2017-09-01 NOTE — Plan of Care (Signed)
Pt alert and oriented times four. No complaints of pain at this time

## 2017-09-01 NOTE — Progress Notes (Signed)
Dennis Nunez [id number] Admission Data: 09/01/2017 3:35 PM Attending Provider: Karmen Bongo, MD  PCP:Van Wendie Chess, MD Consults/ Treatment Team: Treatment Team:  Irene Shipper, MD Ccs, Md, MD  Dennis Nunez is a 72 y.o. male patient admitted from ED awake, alert  & orientated  X 3,  Full Code, VSS - Blood pressure (!) 128/56, pulse (!) 50, temperature 98 F (36.7 C), temperature source Oral, resp. rate 19, height 5\' 11"  (1.803 m), weight 126.4 kg, SpO2 97 %.,  no c/o shortness of breath, no c/o chest pain, no distress noted. T   IV site WDL:  with a transparent dsg that's clean dry and intact.  Allergies:   Allergies  Allergen Reactions  . Penicillins Hives    Has patient had a PCN reaction causing immediate rash, facial/tongue/throat swelling, SOB or lightheadedness with hypotension: No Has patient had a PCN reaction causing severe rash involving mucus membranes or skin necrosis: No Has patient had a PCN reaction that required hospitalization: No Has patient had a PCN reaction occurring within the last 10 years: No If all of the above answers are "NO", then may proceed with Cephalosporin use.     Past Medical History:  Diagnosis Date  . Atrial fibrillation ([place])    takes Dilt but not apparently AC  . CHF (congestive heart failure) ([place])   . CKD (chronic kidney disease)   . Dyslipidemia   . Gout   . Type 2 diabetes mellitus ([place])      History:  obtained from the patient. Tobacco/alcohol: denied none  Pt orientation to unit, room and routine. Information packet given to patient/family and safety video watched.  Admission INP armband ID verified with patient/family, and in place. SR up x 2, fall risk assessment complete with Patient and family verbalizing understanding of risks associated with falls. Pt verbalizes an understanding of how to use the call bell and to call for help before getting out of bed.     Will cont to monitor and assist as needed.  Dennis N Mehr Depaoli,  RN 09/01/2017 3:35 PM

## 2017-09-01 NOTE — Plan of Care (Signed)
Pt resting comfortably in bed. In no apparent distress

## 2017-09-01 NOTE — H&P (Signed)
History and Physical    Dennis Nunez WCH:[id number] DOB: 04-22-1945 DOA: 09/01/2017  PCP: Townsend Roger, MD Consultants:  [facility] - GI; Jefm Bryant - eye Patient coming from:  Home - lives alone; NOK: [name], friend, [phone]  Chief Complaint: Abdominal pain  HPI: Dennis Nunez is a 72 y.o. male with medical history significant of DM and CKD (unspecified) presented as a transfer from [facility] with gallstone pancreatitis.  He had a bout with pancreatitis and called 911 because his BP got so low he was staggering and he Nunez a couple of times and didn't know if he could get up.  He first noticed last night some gas and bloating in his midepigastric region with chest pain.  He went home from church and took Prilosec with some relief.  About 9pm, he started feeling nauseated.  Vomiting x 6.  Then he started sweating profusely.  He continues to have pain in the "pit of my stomach", sore, ongoing anorexia, some nausea.  He has a h/o fast heart beat and he takes medicine for this (?afib) - the next day it would feel sore and that is how his abdomen feels now.  He has had some real bad heartburn a few times previously, but it resolved with Prilosec.  He never had surgery so he still has a gallbladder.  No fever.   ED Course:   Accepted by Dr. Tamala Julian, who reported: Pmh of DM type II and renal insufficiency; who presented with N/V and epigastric pain. Initial BP 60/40 improved to 125/60 after 4 L of IV fluid and O2 sats maintained on room air. Creatinine 2.5( at Pt baseline), lipase 40,000. CT showing uncomplicated pancreatitis and signs of cholelithiasis without acute cholecystitis. Requesting transfer for need of GI. Accepted patient to a MedSurg bed as inpatient if GI formally consulted prior to transport.   Review of Systems: As per HPI; otherwise review of systems reviewed and negative.   Ambulatory Status:  Ambulates without assistance  Past Medical History:  Diagnosis Date  .  Atrial fibrillation ([place])    takes Dilt but not apparently AC  . CHF (congestive heart failure) ([place])   . CKD (chronic kidney disease)   . Dyslipidemia   . Gout   . Type 2 diabetes mellitus ([place])     History reviewed. No pertinent surgical history.  Social History   Socioeconomic History  . Marital status: Single    Spouse name: Not on file  . Number of children: Not on file  . Years of education: Not on file  . Highest education level: Not on file  Occupational History  . 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 Financial resource strain: Not on file  . Food insecurity:    Worry: Not on file    Inability: Not on file  . Transportation needs:    Medical: Not on file    Non-medical: Not on file  Tobacco Use  . Smoking status: Never Smoker  . Smokeless tobacco: Never Used  Substance and Sexual Activity  . Alcohol use: Never    Frequency: Never  . Drug use: Never  . Sexual activity: Not on file  Lifestyle  . Physical activity:    Days per week: Not on file    Minutes per session: Not on file  . Stress: Not on file  Relationships  . Social connections:    Talks on phone: Not on file  Gets together: Not on file    Attends religious service: Not on file    Active member of club or organization: Not on file    Attends meetings of clubs or organizations: Not on file    Relationship status: Not on file  . Intimate partner violence:    Fear of current or ex partner: Not on file    Emotionally abused: Not on file    Physically abused: Not on file    Forced sexual activity: Not on file  Other Topics Concern  . Not on file  Social History Narrative  . Not on file    Allergies  Allergen Reactions  . Penicillins Hives    Has patient had a PCN reaction causing immediate rash, facial/tongue/throat swelling, SOB or lightheadedness with hypotension: No Has patient had a PCN reaction causing severe rash involving mucus  membranes or skin necrosis: No Has patient had a PCN reaction that required hospitalization: No Has patient had a PCN reaction occurring within the last 10 years: No If all of the above answers are "NO", then may proceed with Cephalosporin use.    History reviewed. No pertinent family history.  Prior to Admission medications   Not on File    Physical Exam: Vitals:   09/01/17 0957  BP: (!) 128/56  Pulse: (!) 50  Resp: 19  Temp: 98 F (36.7 C)  TempSrc: Oral  SpO2: 97%  Weight: 126.4 kg  Height: 5\' 11"  (1.803 m)     General:  Appears calm and comfortable and is NAD; he is very conversant Eyes:   EOMI, normal lids, iris ENT:  grossly normal hearing, lips & tongue, mmm; poor dentition Neck:  no LAD, masses or thyromegaly Cardiovascular:  RRR, no m/r/g.  Respiratory:   CTA bilaterally with no wheezes/rales/rhonchi.  Normal respiratory effort. Abdomen:  soft, diffusely tender to palpation, ND, NABS, diffuse voluntary guarding without rebound Skin:  no rash or induration seen on limited exam Musculoskeletal:  grossly normal tone BUE/BLE, good ROM, no bony abnormality Lower extremity:  Chronic 2+ LE edema with hyerpigmentation suggestive of chronic stasis.  Limited foot exam with no ulcerations.  2+ distal pulses. Psychiatric:  grossly normal mood and affect, speech fluent and appropriate, AOx3 Neurologic:  CN 2-12 grossly intact, moves all extremities in coordinated fashion, sensation intact    Radiological Exams on Admission: No results found.  EKG: none   Labs on Admission: I have personally reviewed the available labs and imaging studies at the time of the admission.  Pertinent labs:   Glucose 125 BUN 43/Creatinine 2.50/GFR 24 (at baseline, per Dr. Tamala Julian) Albumin 3.3 AST 90/ALT 105 Cholesterol: 108/23/59/128 WBC 12.0 Hgb 11.0 INR 1.25 A1c 6.0  CT showed uncomplicated pancreatitis with cholelithiasis  Assessment/Plan Principal Problem:    Pancreatitis Active Problems:   Atrial fibrillation, chronic (HCC)   Chronic diastolic CHF (congestive heart failure) (HCC)   CKD (chronic kidney disease), stage III ([place])   Dyslipidemia   Controlled diabetes mellitus ([place])   Pancreatitis -Patient without prior h/o pancreatitis presenting with frank pancreatitis by H&P, elevated lipase (reportedly - repeat level pending), elevated LFTs -Will admit, as he is likely to remain in the hospital for several days to complete his evaluation and treatment and allow the pancreatitis time to improve -Strict NPO for now -Aggressive IVF hydration at least for the first 12 hours with LR at 150 cc/hr (slightly less than usual due to reported h/o CHF) -Pain control with morphine 2 mg q2h prn -Nausea  control with Zofran -The 4 most likely causes for pancreatitis include:  -Gallstones - he has a gallbladder and CT showed gallstones.  This appears to be the most likely etiology.  GI consult ordered -Alcohol - denies use  -Medications - takes several medications which can potentially cause pancreatitis, including pravastatin and prilosec.  Given presence of gallstones, however, this seems less likely as the etiology for his pancreatitis.  -Hypertriglyceridemia - Lipids checked and this does not appear to be the cause. -Surgery consult requested for possible cholecystectomy   Afib -In discussing with the patient, this appears to be a likely diagnosis although he was not clearly able to articulate it in these terms -He does take Dig, which he reported was for rate control -He is taking ASA daily and does not appear to have a contraindication for [facility] despite a CHA2DS2-VASc score of 4 -Will request records from PCP to try to determine if [facility] should be started at the time of d/c vs. As an outpatient  CHF -Also reports h/o CHF -No echo available and this does not appear to be related to current complaints -Appears to be compensated (although he does have chronic  LE edema from stasis) -Obtain records, as above  DM -A1c is 6.0, indicating good control -Continue Tresiba and moderate-scale SSI  HLD -Hold pravastatin for now  HTN -He was hypotensive at the time of presentation to [place] BP medications at this time     DVT prophylaxis:  SCDs Code Status:  Full - confirmed with patient Family Communication: None present  Disposition Plan:  Home once clinically improved Consults called: GI; Surgery  Admission status: Admit - It is my clinical opinion that admission to [facility] is reasonable and necessary because of the expectation that this patient will require hospital care that crosses at least 2 midnights to treat this condition based on the medical complexity of the problems presented.  Given the aforementioned information, the predictability of an adverse outcome is felt to be significant.    Karmen Bongo MD [facility]  If note is complete, please contact covering daytime or nighttime physician. [email or web address] Password [email or web address]  09/01/2017, 1:49 PM

## 2017-09-01 NOTE — Consult Note (Signed)
[facility] Surgery Consult Note  Dennis Nunez 08-21-1945  [id number].    Requesting MD: Lorin Mercy Chief Complaint/Reason for Consult: gallstone pancreatitis HPI:  Patient is a 72 year old male transferred to [name] for GI evaluation due to acute gallstone pancreatitis. Patient reports onset of pain last night that was generalized and characterized as soreness. Started feeling sick at church around 6 PM, tried omeprazole without relief. Vomited up what he had eaten earlier. Later in the evening he was trying to use the restroom and when he stood up he became lightheaded, diaphoretic and fell to his knees. He then vomited again. He called EMS and they took him to [facility]. He reports an episode of similar pain several weeks ago that was less severe and got better with time. Patient reports dull generalized abdominal pain still present. Denies nausea but states he has no appetite. Patient denies fever, chills, chest pain, SOB, urinary symptoms, constipation or diarrhea. PMH significant for T2DM on insulin, CKD with hx of rhabdo 5 years ago, CHF, and PAF. He is not on anticoagulation. He is allergic to PCNs. Has had a R inguinal hernia repair 20+ years ago.   Lipase reportedly 41,000 at [facility]. 952 today.   Noncontrast CT abdomen pelvis 2.4 cm lesion in the dome of the liver, similar to CT of 07/02/2016.  Based on prior CT, this is likely a cyst.  Stone in the gallbladder fundus, no pericholecystic fluid or evidence for acute cholecystitis.  No intrahepatic biliary ductal dilatation.  Diffuse inflammatory changes in the pancreas consistent with acute pancreatitis.  Moderate stool burden.  Right renal cyst recommend ultrasound to further evaluate.  Small, fat-containing umbilical hernia.  ROS: Review of Systems  Constitutional: Positive for diaphoresis. Negative for chills and fever.  Respiratory: Negative for shortness of breath and wheezing.   Cardiovascular:  Negative for chest pain, palpitations and leg swelling.  Gastrointestinal: Positive for abdominal pain, heartburn, nausea and vomiting. Negative for blood in stool, constipation, diarrhea and melena.  Genitourinary: Negative for dysuria, frequency and urgency.  All other systems reviewed and are negative.   History reviewed. No pertinent family history.  Past Medical History:  Diagnosis Date  . Atrial fibrillation ([place])    takes Dilt but not apparently AC  . CHF (congestive heart failure) ([place])   . CKD (chronic kidney disease)   . Dyslipidemia   . Gout   . Type 2 diabetes mellitus ([place])     History reviewed. No pertinent surgical history.  Social History:  reports that he has never smoked. He has never used smokeless tobacco. He reports that he does not drink alcohol or use drugs.  Allergies:  Allergies  Allergen Reactions  . Penicillins Hives    Has patient had a PCN reaction causing immediate rash, facial/tongue/throat swelling, SOB or lightheadedness with hypotension: No Has patient had a PCN reaction causing severe rash involving mucus membranes or skin necrosis: No Has patient had a PCN reaction that required hospitalization: No Has patient had a PCN reaction occurring within the last 10 years: No If all of the above answers are "NO", then may proceed with Cephalosporin use.    Medications Prior to Admission  Medication Sig Dispense Refill  . allopurinol (ZYLOPRIM) 100 MG tablet Take 200 mg by mouth daily.  3  . cholecalciferol (VITAMIN D) 1000 units tablet Take 1,000 Units by mouth daily.    . digoxin (LANOXIN) 0.125 MG tablet Take 125 mcg by mouth daily.  3  .  ferrous sulfate 325 (65 FE) MG tablet Take 325 mg by mouth daily.  3  . furosemide (LASIX) 40 MG tablet Take 80 mg by mouth 2 (two) times daily.  3  . HUMALOG KWIKPEN 100 UNIT/ML KiwkPen Inject 10-11 Units into the skin 2 (two) times daily with a meal.  3  . KLOR-CON M20 20 MEQ tablet Take 20 mEq by mouth 2  (two) times daily.    . lisinopril (PRINIVIL,ZESTRIL) 40 MG tablet Take 40 mg by mouth daily.  3  . loperamide (IMODIUM) 2 MG capsule Take 4 mg by mouth as needed for diarrhea or loose stools.    . metoprolol succinate (TOPROL-XL) 25 MG 24 hr tablet Take 25 mg by mouth daily.  3  . niacin (NIASPAN) 500 MG CR tablet Take 500 mg by mouth at bedtime.  2  . omeprazole (PRILOSEC) 20 MG capsule Take 20 mg by mouth daily. Before breakfast  12  . pravastatin (PRAVACHOL) 40 MG tablet Take 40 mg by mouth at bedtime.    . TRESIBA FLEXTOUCH 100 UNIT/ML SOPN FlexTouch Pen Inject 34 Units into the skin at bedtime.  11    Blood pressure (!) 128/56, pulse (!) 50, temperature 98 F (36.7 C), temperature source Oral, resp. rate 19, height 5' 11" (1.803 m), weight 126.4 kg, SpO2 97 %. Physical Exam: Physical Exam  Constitutional: He is oriented to person, place, and time. He appears well-developed and well-nourished. He is cooperative.  Non-toxic appearance. No distress.  HENT:  Head: Normocephalic and atraumatic.  Right Ear: External ear normal.  Left Ear: External ear normal.  Nose: Nose normal.  Mouth/Throat: Mucous membranes are normal. Abnormal dentition.  Eyes: Conjunctivae, EOM and lids are normal. No scleral icterus.  Pupils equal and round  Neck: Normal range of motion and phonation normal. Neck supple.  Cardiovascular: Normal rate and regular rhythm.  Pulses:      Radial pulses are 2+ on the right side, and 2+ on the left side.       Dorsalis pedis pulses are 2+ on the right side, and 2+ on the left side.  Trace edema in feet bilaterally, varicosities bilaterally  Pulmonary/Chest: Effort normal and breath sounds normal.  Abdominal: Soft. He exhibits no distension. Bowel sounds are decreased. There is generalized tenderness. There is no rigidity, no rebound and no guarding. A hernia (umbilical, not incarcerated) is present.  Musculoskeletal:  ROM grossly intact in bilateral upper and lower  extremities  Neurological: He is alert and oriented to person, place, and time. No sensory deficit.  Skin: Skin is warm, dry and intact.  Psychiatric: He has a normal mood and affect. His speech is normal and behavior is normal.    Results for orders placed or performed during the hospital encounter of 09/01/17 (from the past 48 hour(s))  Glucose, capillary     Status: Abnormal   Collection Time: 09/01/17 11:51 AM  Result Value Ref Range   Glucose-Capillary 115 (H) 70 - 99 mg/dL  Hemoglobin A1c     Status: Abnormal   Collection Time: 09/01/17 12:23 PM  Result Value Ref Range   Hgb A1c MFr Bld 6.0 (H) 4.8 - 5.6 %    Comment: (NOTE) Pre diabetes:          5.7%-6.4% Diabetes:              >6.4% Glycemic control for   <7.0% adults with diabetes    Mean Plasma Glucose 125.5 mg/dL    Comment: Performed   at [facility], [address] [place], [place] [address]  Comprehensive metabolic panel     Status: Abnormal   Collection Time: 09/01/17 12:23 PM  Result Value Ref Range   Sodium 142 135 - 145 mmol/L   Potassium 4.9 3.5 - 5.1 mmol/L   Chloride 112 (H) 98 - 111 mmol/L   CO2 22 22 - 32 mmol/L   Glucose, Bld 125 (H) 70 - 99 mg/dL   BUN 43 (H) 8 - 23 mg/dL   Creatinine, Ser 2.50 (H) 0.61 - 1.24 mg/dL   Calcium 7.7 (L) 8.9 - 10.3 mg/dL   Total Protein 6.4 (L) 6.5 - 8.1 g/dL   Albumin 3.3 (L) 3.5 - 5.0 g/dL   AST 90 (H) 15 - 41 U/L   ALT 105 (H) 0 - 44 U/L   Alkaline Phosphatase 67 38 - 126 U/L   Total Bilirubin 0.7 0.3 - 1.2 mg/dL   GFR calc non Af Amer 24 (L) >60 mL/min   GFR calc Af Amer 28 (L) >60 mL/min    Comment: (NOTE) The eGFR has been calculated using the CKD EPI equation. This calculation has not been validated in all clinical situations. eGFR's persistently <60 mL/min signify possible Chronic Kidney Disease.    Anion gap 8 5 - 15    Comment: Performed at [facility], [address] [place], [place] [address]  CBC WITH DIFFERENTIAL     Status: Abnormal    Collection Time: 09/01/17 12:23 PM  Result Value Ref Range   WBC 12.0 (H) 4.0 - 10.5 K/uL   RBC 3.49 (L) 4.22 - 5.81 MIL/uL   Hemoglobin 11.0 (L) 13.0 - 17.0 g/dL   HCT 34.5 (L) 39.0 - 52.0 %   MCV 98.9 78.0 - 100.0 fL   MCH 31.5 26.0 - 34.0 pg   MCHC 31.9 30.0 - 36.0 g/dL   RDW 14.7 11.5 - 15.5 %   Platelets 124 (L) 150 - 400 K/uL   Neutrophils Relative % 84 %   Neutro Abs 9.9 (H) 1.7 - 7.7 K/uL   Lymphocytes Relative 10 %   Lymphs Abs 1.2 0.7 - 4.0 K/uL   Monocytes Relative 5 %   Monocytes Absolute 0.6 0.1 - 1.0 K/uL   Eosinophils Relative 1 %   Eosinophils Absolute 0.2 0.0 - 0.7 K/uL   Basophils Relative 0 %   Basophils Absolute 0.0 0.0 - 0.1 K/uL   Immature Granulocytes 0 %   Abs Immature Granulocytes 0.0 0.0 - 0.1 K/uL    Comment: Performed at [facility], [address] [place], [place] [address]  Protime-INR     Status: Abnormal   Collection Time: 09/01/17 12:23 PM  Result Value Ref Range   Prothrombin Time 15.6 (H) 11.4 - 15.2 seconds   INR 1.25     Comment: Performed at [facility], [address] [place], [place] [address]  Lipid panel     Status: Abnormal   Collection Time: 09/01/17 12:23 PM  Result Value Ref Range   Cholesterol 108 0 - 200 mg/dL   Triglycerides 128 <150 mg/dL   HDL 23 (L) >40 mg/dL   Total CHOL/HDL Ratio 4.7 RATIO   VLDL 26 0 - 40 mg/dL   LDL Cholesterol 59 0 - 99 mg/dL    Comment:        Total Cholesterol/HDL:CHD Risk Coronary Heart Disease Risk Table                       Men   Women  1/2 Average Risk   3.4   3.3  Average Risk       5.0   4.4  2 X Average Risk   9.6   7.1  3 X Average Risk  23.4   11.0        Use the calculated Patient Ratio above and the CHD Risk Table to determine the patient's CHD Risk.        ATP III CLASSIFICATION (LDL):  <100     mg/dL   Optimal  100-129  mg/dL   Near or Above                    Optimal  130-159  mg/dL   Borderline  160-189  mg/dL   High  >190     mg/dL   Very  High Performed at [facility] [address] [place], [place] [address]    No results found.    Assessment/Plan Type 2 Diabetes Mellitus  CKD stage III  Atrial fibrillation  CHF  Gallstone pancreatitis - lipase reportedly 41,000 on admission, 952 today - will recheck in AM - Tx pancreatitis with IVF and NPO, may allow ice chips - will follow for readiness for possible laparoscopic cholecystectomy - if patient acutely worsens or is not improving may need repeat CT in several days  FEN: NPO w/ ice, IVF VTE: SCDs ID: no current abx  Brigid Re, [facility] Surgery 09/01/2017, 2:42 PM Pager: [phone] Consults: [phone] Mon-Fri 7:00 am-4:30 pm Sat-Sun 7:00 am-11:30 am

## 2017-09-02 DIAGNOSIS — K851 Biliary acute pancreatitis without necrosis or infection: Principal | ICD-10-CM

## 2017-09-02 DIAGNOSIS — N183 Chronic kidney disease, stage 3 (moderate): Secondary | ICD-10-CM

## 2017-09-02 LAB — COMPREHENSIVE METABOLIC PANEL
ALBUMIN: 3.1 g/dL — AB (ref 3.5–5.0)
ALT: 76 U/L — ABNORMAL HIGH (ref 0–44)
ANION GAP: 7 (ref 5–15)
AST: 46 U/L — ABNORMAL HIGH (ref 15–41)
Alkaline Phosphatase: 59 U/L (ref 38–126)
BILIRUBIN TOTAL: 0.8 mg/dL (ref 0.3–1.2)
BUN: 31 mg/dL — ABNORMAL HIGH (ref 8–23)
CHLORIDE: 109 mmol/L (ref 98–111)
CO2: 24 mmol/L (ref 22–32)
Calcium: 7.8 mg/dL — ABNORMAL LOW (ref 8.9–10.3)
Creatinine, Ser: 1.96 mg/dL — ABNORMAL HIGH (ref 0.61–1.24)
GFR calc Af Amer: 37 mL/min — ABNORMAL LOW (ref >60)
GFR calc non Af Amer: 32 mL/min — ABNORMAL LOW (ref >60)
Glucose, Bld: 121 mg/dL — ABNORMAL HIGH (ref 70–99)
POTASSIUM: 4 mmol/L (ref 3.5–5.1)
Sodium: 140 mmol/L (ref 135–145)
TOTAL PROTEIN: 5.8 g/dL — AB (ref 6.5–8.1)

## 2017-09-02 LAB — CBC
HEMATOCRIT: 32.7 % — AB (ref 39.0–52.0)
HEMOGLOBIN: 10.6 g/dL — AB (ref 13.0–17.0)
MCH: 31.8 pg (ref 26.0–34.0)
MCHC: 32.4 g/dL (ref 30.0–36.0)
MCV: 98.2 fL (ref 78.0–100.0)
Platelets: 160 10*3/uL (ref 150–400)
RBC: 3.33 MIL/uL — ABNORMAL LOW (ref 4.22–5.81)
RDW: 14.8 % (ref 11.5–15.5)
WBC: 12.2 10*3/uL — AB (ref 4.0–10.5)

## 2017-09-02 LAB — GLUCOSE, CAPILLARY
GLUCOSE-CAPILLARY: 101 mg/dL — AB (ref 70–99)
GLUCOSE-CAPILLARY: 113 mg/dL — AB (ref 70–99)
GLUCOSE-CAPILLARY: 127 mg/dL — AB (ref 70–99)
GLUCOSE-CAPILLARY: 92 mg/dL (ref 70–99)
Glucose-Capillary: 111 mg/dL — ABNORMAL HIGH (ref 70–99)
Glucose-Capillary: 88 mg/dL (ref 70–99)
Glucose-Capillary: 155 mg/dL — ABNORMAL HIGH (ref 70–99)

## 2017-09-02 LAB — LIPASE, BLOOD: Lipase: 197 U/L — ABNORMAL HIGH (ref 11–51)

## 2017-09-02 MED ORDER — METOPROLOL SUCCINATE ER 25 MG PO TB24
25.0000 mg | ORAL_TABLET | Freq: Every day | ORAL | Status: DC
  Administered 2017-09-02 – 2017-09-06 (×4): 25 mg via ORAL
  Filled 2017-09-02 (×5): qty 1

## 2017-09-02 MED ORDER — BISACODYL 5 MG PO TBEC
10.0000 mg | DELAYED_RELEASE_TABLET | Freq: Once | ORAL | Status: AC
  Administered 2017-09-02: 10 mg via ORAL
  Filled 2017-09-02: qty 2

## 2017-09-02 MED ORDER — INSULIN GLARGINE 100 UNIT/ML ~~LOC~~ SOLN
15.0000 [IU] | Freq: Every day | SUBCUTANEOUS | Status: DC
  Administered 2017-09-02 – 2017-09-05 (×4): 15 [IU] via SUBCUTANEOUS
  Filled 2017-09-02 (×5): qty 0.15

## 2017-09-02 NOTE — Progress Notes (Signed)
Patient Demographics:    Dennis Nunez, is a 72 y.o. male, DOB - 29-Oct-1945, XBW:[id number]  Admit date - 09/01/2017   Admitting Physician Karmen Bongo, MD  Outpatient Primary MD for the patient is [name], [name], MD  LOS - 1   No chief complaint on file.       Subjective:    Dennis Nunez today has  No chest pain, nausea but no emesis,  Assessment  & Plan :    Principal Problem:   Pancreatitis Active Problems:   Atrial fibrillation, chronic (HCC)   Chronic diastolic CHF (congestive heart failure) (HCC)   CKD (chronic kidney disease), stage III (HCC)   Dyslipidemia   Controlled diabetes mellitus ([place])    1)Gallstone Pancreatitis --GI surgical consult appreciated, patient is for possible laparoscopic cholecystectomy with IOC on 09/03/17, lipase continued to trend down, the patient apparently was over 41 k at outside facility, initial lipase here was 952 down to 197 now  2)H/o PAFib---  CHA2DS2- VASc score   is = 4 (age, HTN, DM, CHF)  Which is  equal to =  4.8 % annual risk of stroke , patient will need to discuss full anticoagulation with PCP in the postop unit/post discharge, no need for anticoagulation at this time due to pending lap chole, rate appears controlled, continue digoxin 25 mg daily   3)DM2-last A1c 6.0, continue Lantus insulin 34 units qhs, with patient is kept n.p.o. reduce Lantus to 15 units tonight from 34,  use Novolog/Humalog Sliding scale insulin with Accu-Cheks/Fingersticks as ordered   4)HTN--- BP was low at outside facility, okay to continue to hold lisinopril, may restart metoprolol 25 mg daily  5)h/o CHF--- previous echo not available, unable to determine systolic versus diastolic, overall patient does not appear to be in florid heart failure or volume overload status at this time, expectant management at this time  6)AKI----acute kidney injury on CKD stage - ??? III   (old records unavailablea) ,    creatinine on admission= 2.5  ,   baseline creatinine = ????   , creatinine is now= 1.96     , renally adjust medications, avoid nephrotoxic agents/dehydration/hypotension, admission creatinine was 2.5, creatinine down to 1.96 with hydration   Disposition/Need for in-Hospital Stay- patient unable to be discharged at this time due to acute kidney injury superimposed on CKD, acute gallstone pancreatitis, most likely require surgical intervention with laparoscopic cholecystectomy, patient is currently n.p.o. requiring IV fluids  Code Status : Full   Disposition Plan  : TBD  Consults  :  Gi/Gen surg   DVT Prophylaxis  :    SCDs   Lab Results  Component Value Date   PLT 160 09/02/2017    Inpatient Medications  Scheduled Meds: . digoxin  125 mcg Oral Daily  . insulin aspart  0-15 Units Subcutaneous TID WC  . insulin aspart  0-5 Units Subcutaneous QHS  . insulin glargine  34 Units Subcutaneous QHS  . pneumococcal 23 valent vaccine  0.5 mL Intramuscular Tomorrow-1000   Continuous Infusions: . lactated ringers 150 mL/hr at 09/02/17 0500   PRN Meds:.acetaminophen **OR** acetaminophen, morphine injection, ondansetron **OR** ondansetron (ZOFRAN) IV    Anti-infectives (From admission, onward)   None  Objective:   Vitals:   09/01/17 1709 09/01/17 2146 09/02/17 0524 09/02/17 1418  BP:  103/65 (!) 116/56 121/61  Pulse: 76 78 81 87  Resp:  16 16 18   Temp:  98.9 F (37.2 C) 99.7 F (37.6 C) 98.8 F (37.1 C)  TempSrc:   Oral Oral  SpO2:  96% 93% 96%  Weight:      Height:        Wt Readings from Last 3 Encounters:  09/01/17 126.4 kg     Intake/Output Summary (Last 24 hours) at 09/02/2017 1621 Last data filed at 09/02/2017 1042 Gross per 24 hour  Intake 2384.69 ml  Output 900 ml  Net 1484.69 ml     Physical Exam  Gen:- Awake Alert, in no apparent distress , obese HEENT:- [place].AT,   poor dentition Neck-Supple Neck,No JVD,.    Lungs-  CTAB, fair symmetrical air movement CV- S1, S2 normal , irregular Abd-  +ve B.Sounds, Abd Soft, epigastric and right and left upper quadrant area tenderness without rebound  Extremity/Skin:- No  edema,   good pulses Psych-affect is appropriate, oriented x3 Neuro-no new focal deficits, no tremors   Data Review:   Micro Results No results found for this or any previous visit (from the past 240 hour(s)).  Radiology Reports No results found.   CBC Recent Labs  Lab 09/01/17 1223 09/02/17 0305  WBC 12.0* 12.2*  HGB 11.0* 10.6*  HCT 34.5* 32.7*  PLT 124* 160  MCV 98.9 98.2  MCH 31.5 31.8  MCHC 31.9 32.4  RDW 14.7 14.8  LYMPHSABS 1.2  --   MONOABS 0.6  --   EOSABS 0.2  --   BASOSABS 0.0  --     Chemistries  Recent Labs  Lab 09/01/17 1223 09/02/17 0305  NA 142 140  K 4.9 4.0  CL 112* 109  CO2 22 24  GLUCOSE 125* 121*  BUN 43* 31*  CREATININE 2.50* 1.96*  CALCIUM 7.7* 7.8*  AST 90* 46*  ALT 105* 76*  ALKPHOS 67 59  BILITOT 0.7 0.8   ------------------------------------------------------------------------------------------------------------------ Recent Labs    09/01/17 1223  CHOL 108  HDL 23*  LDLCALC 59  TRIG 128  CHOLHDL 4.7    Lab Results  Component Value Date   HGBA1C 6.0 (H) 09/01/2017   ------------------------------------------------------------------------------------------------------------------ No results for input(s): TSH, T4TOTAL, T3FREE, THYROIDAB in the last 72 hours.  Invalid input(s): FREET3 ------------------------------------------------------------------------------------------------------------------ No results for input(s): VITAMINB12, FOLATE, FERRITIN, TIBC, IRON, RETICCTPCT in the last 72 hours.  Coagulation profile Recent Labs  Lab 09/01/17 1223  INR 1.25    No results for input(s): DDIMER in the last 72 hours.  Cardiac Enzymes No results for input(s): CKMB, TROPONINI, MYOGLOBIN in the last 168  hours.  Invalid input(s): CK ------------------------------------------------------------------------------------------------------------------ No results found for: BNP   Roxan Hockey M.D on 09/02/2017 at 4:21 PM  Pager---[phone] Go to [email or web address] - password [email or web address] for contact info  [facility] - Office  [phone]

## 2017-09-02 NOTE — Progress Notes (Signed)
   Subjective/Chief Complaint: Patient still with some epigastric tenderness No nausea or vomiting    Objective: Vital signs in last 24 hours: Temp:  [98 F (36.7 C)-99.7 F (37.6 C)] 99.7 F (37.6 C) (08/30 0524) Pulse Rate:  [50-81] 81 (08/30 0524) Resp:  [16-19] 16 (08/30 0524) BP: (103-128)/(56-65) 116/56 (08/30 0524) SpO2:  [93 %-97 %] 93 % (08/30 0524) Weight:  [126.4 kg] 126.4 kg (08/29 0957) Last BM Date: 08/31/17  Intake/Output from previous day: 08/29 0701 - 08/30 0700 In: 2384.7 [I.V.:2384.7] Out: 1250 [Urine:1250] Intake/Output this shift: No intake/output data recorded.  WDWN in NAD Eyes:  Pupils equal, round; sclera anicteric HENT:  Oral mucosa moist; poor dentition  Neck:  No masses palpated, no thyromegaly Lungs:  CTA bilaterally; normal respiratory effort CV:  Regular rate and rhythm; no murmurs; extremities well-perfused with no edema Abd:  +bowel sounds, obese, epigastric tenderness, protruding umbilical hernia - reducible Skin:  Warm, dry; no sign of jaundice Psychiatric - alert and oriented x 4; calm mood and affect   Lab Results:  Recent Labs    09/01/17 1223 09/02/17 0305  WBC 12.0* 12.2*  HGB 11.0* 10.6*  HCT 34.5* 32.7*  PLT 124* 160   BMET Recent Labs    09/01/17 1223 09/02/17 0305  NA 142 140  K 4.9 4.0  CL 112* 109  CO2 22 24  GLUCOSE 125* 121*  BUN 43* 31*  CREATININE 2.50* 1.96*  CALCIUM 7.7* 7.8*   Hepatic Function Latest Ref Rng & Units 09/02/2017 09/01/2017  Total Protein 6.5 - 8.1 g/dL 5.8(L) 6.4(L)  Albumin 3.5 - 5.0 g/dL 3.1(L) 3.3(L)  AST 15 - 41 U/L 46(H) 90(H)  ALT 0 - 44 U/L 76(H) 105(H)  Alk Phosphatase 38 - 126 U/L 59 67  Total Bilirubin 0.3 - 1.2 mg/dL 0.8 0.7   Lipase     Component Value Date/Time   LIPASE 197 (H) 09/02/2017 0305    PT/INR Recent Labs    09/01/17 1223  LABPROT 15.6*  INR 1.25   ABG No results for input(s): PHART, HCO3 in the last 72 hours.  Invalid input(s): PCO2,  PO2  Studies/Results: No results found.  Anti-infectives: Anti-infectives (From admission, onward)   None      Assessment/Plan: Type 2 Diabetes Mellitus  CKD stage III  Atrial fibrillation  CHF  Gallstone pancreatitis - lipase reportedly 41,000 on admission, 952 yesterday, 197 today - will recheck in AM - Tx pancreatitis with IVF and NPO, may allow clear liquids today - will follow for readiness for possible laparoscopic cholecystectomy tomorrow - if patient acutely worsens or is not improving may need repeat CT in several days  FEN: NPO w/ ice, IVF VTE: SCDs ID: no current abx  LOS: 1 day    Maia Petties 09/02/2017

## 2017-09-02 NOTE — Progress Notes (Addendum)
                                                                    Daily Rounding Note  09/02/2017, 12:19 PM  LOS: 1 day   SUBJECTIVE:   Continues to just have pain with physical exam and that is improved.  No nausea.  Tolerating clear liquids.  OBJECTIVE:         Vital signs in last 24 hours:    Temp:  [98 F (36.7 C)-99.7 F (37.6 C)] 99.7 F (37.6 C) (08/30 0524) Pulse Rate:  [68-81] 81 (08/30 0524) Resp:  [16-19] 16 (08/30 0524) BP: (103-122)/(56-65) 116/56 (08/30 0524) SpO2:  [93 %-96 %] 93 % (08/30 0524) Last BM Date: 08/31/17 Filed Weights   09/01/17 0957  Weight: 126.4 kg   General: Obese.  Looks well.  Comfortable.  Not toxic. Heart: RRR. Chest: Clear bilaterally.  No labored breathing. Abdomen: BS.  Minimally tender in the upper/left abdomen.  No guarding or rebound.  Active bowel sounds. Extremities: Nonpitting edema in the lower legs. Neuro/Psych: Oriented x3.  Talkative.  Fully alert and oriented.  Intake/Output from previous day: 08/29 0701 - 08/30 0700 In: 2384.7 [I.V.:2384.7] Out: 1250 [Urine:1250]  Intake/Output this shift: Total I/O In: 0  Out: 200 [Urine:200]  Lab Results: Recent Labs    09/01/17 1223 09/02/17 0305  WBC 12.0* 12.2*  HGB 11.0* 10.6*  HCT 34.5* 32.7*  PLT 124* 160   BMET Recent Labs    09/01/17 1223 09/02/17 0305  NA 142 140  K 4.9 4.0  CL 112* 109  CO2 22 24  GLUCOSE 125* 121*  BUN 43* 31*  CREATININE 2.50* 1.96*  CALCIUM 7.7* 7.8*   LFT Recent Labs    09/01/17 1223 09/02/17 0305  PROT 6.4* 5.8*  ALBUMIN 3.3* 3.1*  AST 90* 46*  ALT 105* 76*  ALKPHOS 67 59  BILITOT 0.7 0.8   PT/INR Recent Labs    09/01/17 1223  LABPROT 15.6*  INR 1.25   Hepatitis Panel No results for input(s): HEPBSAG, HCVAB, HEPAIGM, HEPBIGM in the last 72 hours.  Studies/Results: No results found.  ASSESMENT:   *   Biliary pancreatitis. No evidence of choledocholithiasis.  *   Cholelithiasis.    PLAN   *    Dr. Georgette Dover from surgery has seen the patient His plan is for possible lap chole tomorrow. If he does not improve then  may need repeat CT scan in several days.  *   In case of need for future GI follow-up, his GI physician is Dr. Lyda Jester in [place]. We are signing off.  Please call if patient needs further GI care while inpatient.  Azucena Freed  09/02/2017, 12:19 PM Phone [phone]  GI ATTENDING  Interval history data reviewed. Patient seen and examined. Agree with interval progress note. Patient doing well overall. Biliary pancreatitis. Plans for laparoscopic cholecystectomy with IOC noted. Advance diet as tolerated. GI will sign off.  Docia Chuck. Geri Seminole., M.D. [facility] Division of Gastroenterology

## 2017-09-03 ENCOUNTER — Inpatient Hospital Stay (HOSPITAL_COMMUNITY): Payer: Medicare Other

## 2017-09-03 ENCOUNTER — Inpatient Hospital Stay (HOSPITAL_COMMUNITY): Payer: Medicare Other | Admitting: Anesthesiology

## 2017-09-03 ENCOUNTER — Encounter (HOSPITAL_COMMUNITY): Admission: AD | Disposition: A | Discharge status: Home / Self Care | Payer: Self-pay | Attending: Family Medicine

## 2017-09-03 HISTORY — PX: CHOLECYSTECTOMY: SHX55

## 2017-09-03 LAB — COMPREHENSIVE METABOLIC PANEL
ALBUMIN: 2.8 g/dL — AB (ref 3.5–5.0)
ALT: 52 U/L — AB (ref 0–44)
ALT: 46 U/L — AB (ref 0–44)
ANION GAP: 7 (ref 5–15)
ANION GAP: 9 (ref 5–15)
AST: 25 U/L (ref 15–41)
AST: 28 U/L (ref 15–41)
Albumin: 2.9 g/dL — ABNORMAL LOW (ref 3.5–5.0)
Alkaline Phosphatase: 63 U/L (ref 38–126)
Alkaline Phosphatase: 62 U/L (ref 38–126)
BILIRUBIN TOTAL: 1.1 mg/dL (ref 0.3–1.2)
BUN: 21 mg/dL (ref 8–23)
BUN: 20 mg/dL (ref 8–23)
CALCIUM: 7.8 mg/dL — AB (ref 8.9–10.3)
CO2: 26 mmol/L (ref 22–32)
CO2: 26 mmol/L (ref 22–32)
Calcium: 8.1 mg/dL — ABNORMAL LOW (ref 8.9–10.3)
Chloride: 108 mmol/L (ref 98–111)
Chloride: 105 mmol/L (ref 98–111)
Creatinine, Ser: 1.73 mg/dL — ABNORMAL HIGH (ref 0.61–1.24)
Creatinine, Ser: 1.66 mg/dL — ABNORMAL HIGH (ref 0.61–1.24)
GFR calc Af Amer: 43 mL/min — ABNORMAL LOW (ref >60)
GFR calc Af Amer: 46 mL/min — ABNORMAL LOW (ref >60)
GFR calc non Af Amer: 39 mL/min — ABNORMAL LOW (ref >60)
GFR, EST NON AFRICAN AMERICAN: 37 mL/min — AB (ref >60)
GLUCOSE: 99 mg/dL (ref 70–99)
Glucose, Bld: 103 mg/dL — ABNORMAL HIGH (ref 70–99)
POTASSIUM: 3.7 mmol/L (ref 3.5–5.1)
Potassium: 3.5 mmol/L (ref 3.5–5.1)
SODIUM: 140 mmol/L (ref 135–145)
Sodium: 141 mmol/L (ref 135–145)
TOTAL PROTEIN: 5.8 g/dL — AB (ref 6.5–8.1)
Total Bilirubin: 1.1 mg/dL (ref 0.3–1.2)
Total Protein: 5.7 g/dL — ABNORMAL LOW (ref 6.5–8.1)

## 2017-09-03 LAB — CBC
HCT: 33.5 % — ABNORMAL LOW (ref 39.0–52.0)
HCT: 32.6 % — ABNORMAL LOW (ref 39.0–52.0)
HEMOGLOBIN: 10.4 g/dL — AB (ref 13.0–17.0)
Hemoglobin: 10.7 g/dL — ABNORMAL LOW (ref 13.0–17.0)
MCH: 31.3 pg (ref 26.0–34.0)
MCH: 31.2 pg (ref 26.0–34.0)
MCHC: 31.9 g/dL (ref 30.0–36.0)
MCHC: 31.9 g/dL (ref 30.0–36.0)
MCV: 98.2 fL (ref 78.0–100.0)
MCV: 97.7 fL (ref 78.0–100.0)
Platelets: 103 10*3/uL — ABNORMAL LOW (ref 150–400)
Platelets: 100 10*3/uL — ABNORMAL LOW (ref 150–400)
RBC: 3.43 MIL/uL — AB (ref 4.22–5.81)
RBC: 3.32 MIL/uL — AB (ref 4.22–5.81)
RDW: 14.7 % (ref 11.5–15.5)
RDW: 14.6 % (ref 11.5–15.5)
WBC: 13.7 10*3/uL — AB (ref 4.0–10.5)
WBC: 13.8 10*3/uL — AB (ref 4.0–10.5)

## 2017-09-03 LAB — LIPASE, BLOOD
LIPASE: 71 U/L — AB (ref 11–51)
Lipase: 52 U/L — ABNORMAL HIGH (ref 11–51)

## 2017-09-03 LAB — GLUCOSE, CAPILLARY
GLUCOSE-CAPILLARY: 96 mg/dL (ref 70–99)
GLUCOSE-CAPILLARY: 107 mg/dL — AB (ref 70–99)
GLUCOSE-CAPILLARY: 98 mg/dL (ref 70–99)
GLUCOSE-CAPILLARY: 103 mg/dL — AB (ref 70–99)
GLUCOSE-CAPILLARY: 119 mg/dL — AB (ref 70–99)
Glucose-Capillary: 137 mg/dL — ABNORMAL HIGH (ref 70–99)
Glucose-Capillary: 91 mg/dL (ref 70–99)

## 2017-09-03 LAB — SURGICAL PCR SCREEN
MRSA, PCR: NEGATIVE
Staphylococcus aureus: POSITIVE — AB

## 2017-09-03 SURGERY — LAPAROSCOPIC CHOLECYSTECTOMY WITH INTRAOPERATIVE CHOLANGIOGRAM
Anesthesia: General | Site: Abdomen

## 2017-09-03 MED ORDER — MUPIROCIN 2 % EX OINT
TOPICAL_OINTMENT | CUTANEOUS | Status: AC
  Administered 2017-09-03: 1 via NASAL
  Filled 2017-09-03: qty 22

## 2017-09-03 MED ORDER — PROPOFOL 10 MG/ML IV BOLUS
INTRAVENOUS | Status: AC
  Filled 2017-09-03: qty 20

## 2017-09-03 MED ORDER — PHENYLEPHRINE 40 MCG/ML (10ML) SYRINGE FOR IV PUSH (FOR BLOOD PRESSURE SUPPORT)
PREFILLED_SYRINGE | INTRAVENOUS | Status: AC
  Filled 2017-09-03: qty 10

## 2017-09-03 MED ORDER — LACTATED RINGERS IV SOLN
INTRAVENOUS | Status: DC | PRN
  Administered 2017-09-03: 12:00:00 via INTRAVENOUS

## 2017-09-03 MED ORDER — CEFAZOLIN SODIUM 1 G IJ SOLR
INTRAMUSCULAR | Status: AC
  Filled 2017-09-03: qty 20

## 2017-09-03 MED ORDER — FENTANYL CITRATE (PF) 250 MCG/5ML IJ SOLN
INTRAMUSCULAR | Status: DC | PRN
  Administered 2017-09-03 (×2): 50 ug via INTRAVENOUS
  Administered 2017-09-03: 100 ug via INTRAVENOUS

## 2017-09-03 MED ORDER — CIPROFLOXACIN IN D5W 400 MG/200ML IV SOLN
400.0000 mg | Freq: Once | INTRAVENOUS | Status: AC
  Administered 2017-09-03: 400 mg via INTRAVENOUS
  Filled 2017-09-03: qty 200

## 2017-09-03 MED ORDER — SODIUM CHLORIDE 0.9 % IR SOLN
Status: DC | PRN
  Administered 2017-09-03: 1000 mL

## 2017-09-03 MED ORDER — DEXAMETHASONE SODIUM PHOSPHATE 10 MG/ML IJ SOLN
INTRAMUSCULAR | Status: AC
  Filled 2017-09-03: qty 1

## 2017-09-03 MED ORDER — FENTANYL CITRATE (PF) 250 MCG/5ML IJ SOLN
INTRAMUSCULAR | Status: AC
  Filled 2017-09-03: qty 5

## 2017-09-03 MED ORDER — SUGAMMADEX SODIUM 500 MG/5ML IV SOLN
INTRAVENOUS | Status: DC | PRN
  Administered 2017-09-03: 400 mg via INTRAVENOUS

## 2017-09-03 MED ORDER — ROCURONIUM BROMIDE 10 MG/ML (PF) SYRINGE
PREFILLED_SYRINGE | INTRAVENOUS | Status: DC | PRN
  Administered 2017-09-03 (×2): 20 mg via INTRAVENOUS
  Administered 2017-09-03: 50 mg via INTRAVENOUS

## 2017-09-03 MED ORDER — LIDOCAINE 2% (20 MG/ML) 5 ML SYRINGE
INTRAMUSCULAR | Status: AC
  Filled 2017-09-03: qty 5

## 2017-09-03 MED ORDER — ESMOLOL HCL 100 MG/10ML IV SOLN
INTRAVENOUS | Status: AC
  Filled 2017-09-03: qty 20

## 2017-09-03 MED ORDER — EPHEDRINE 5 MG/ML INJ
INTRAVENOUS | Status: AC
  Filled 2017-09-03: qty 10

## 2017-09-03 MED ORDER — ONDANSETRON HCL 4 MG/2ML IJ SOLN
INTRAMUSCULAR | Status: AC
  Filled 2017-09-03: qty 2

## 2017-09-03 MED ORDER — ONDANSETRON HCL 4 MG/2ML IJ SOLN
INTRAMUSCULAR | Status: DC | PRN
  Administered 2017-09-03: 4 mg via INTRAVENOUS

## 2017-09-03 MED ORDER — MIDAZOLAM HCL 2 MG/2ML IJ SOLN
INTRAMUSCULAR | Status: AC
  Filled 2017-09-03: qty 2

## 2017-09-03 MED ORDER — SUGAMMADEX SODIUM 500 MG/5ML IV SOLN
INTRAVENOUS | Status: AC
  Filled 2017-09-03: qty 5

## 2017-09-03 MED ORDER — ROCURONIUM BROMIDE 50 MG/5ML IV SOSY
PREFILLED_SYRINGE | INTRAVENOUS | Status: AC
  Filled 2017-09-03: qty 5

## 2017-09-03 MED ORDER — BUPIVACAINE-EPINEPHRINE (PF) 0.25% -1:200000 IJ SOLN
INTRAMUSCULAR | Status: AC
  Filled 2017-09-03: qty 30

## 2017-09-03 MED ORDER — LIDOCAINE 2% (20 MG/ML) 5 ML SYRINGE
INTRAMUSCULAR | Status: DC | PRN
  Administered 2017-09-03: 100 mg via INTRAVENOUS

## 2017-09-03 MED ORDER — ROCURONIUM BROMIDE 50 MG/5ML IV SOSY
PREFILLED_SYRINGE | INTRAVENOUS | Status: AC
  Filled 2017-09-03: qty 10

## 2017-09-03 MED ORDER — DEXAMETHASONE SODIUM PHOSPHATE 10 MG/ML IJ SOLN
INTRAMUSCULAR | Status: DC | PRN
  Administered 2017-09-03: 5 mg via INTRAVENOUS

## 2017-09-03 MED ORDER — BUPIVACAINE-EPINEPHRINE 0.25% -1:200000 IJ SOLN
INTRAMUSCULAR | Status: DC | PRN
  Administered 2017-09-03: 6 mL

## 2017-09-03 MED ORDER — MUPIROCIN 2 % EX OINT
TOPICAL_OINTMENT | Freq: Two times a day (BID) | CUTANEOUS | Status: DC
  Administered 2017-09-03: 22:00:00 via NASAL
  Administered 2017-09-03: 1 via NASAL
  Administered 2017-09-04 – 2017-09-06 (×5): via NASAL
  Filled 2017-09-03 (×2): qty 22

## 2017-09-03 MED ORDER — IOPAMIDOL (ISOVUE-300) INJECTION 61%
INTRAVENOUS | Status: AC
  Filled 2017-09-03: qty 50

## 2017-09-03 MED ORDER — PROPOFOL 10 MG/ML IV BOLUS
INTRAVENOUS | Status: DC | PRN
  Administered 2017-09-03: 150 mg via INTRAVENOUS

## 2017-09-03 MED ORDER — PHENYLEPHRINE 40 MCG/ML (10ML) SYRINGE FOR IV PUSH (FOR BLOOD PRESSURE SUPPORT)
PREFILLED_SYRINGE | INTRAVENOUS | Status: DC | PRN
  Administered 2017-09-03: 40 ug via INTRAVENOUS

## 2017-09-03 SURGICAL SUPPLY — 45 items
APPLIER CLIP ROT 10 11.4 M/L (STAPLE) ×3
BENZOIN TINCTURE PRP APPL 2/3 (GAUZE/BANDAGES/DRESSINGS) ×3 IMPLANT
BLADE CLIPPER SURG (BLADE) ×3 IMPLANT
CANISTER SUCT 3000ML PPV (MISCELLANEOUS) ×3 IMPLANT
CHLORAPREP W/TINT 26ML (MISCELLANEOUS) ×3 IMPLANT
CLIP APPLIE ROT 10 11.4 M/L (STAPLE) ×1 IMPLANT
CLOSURE WOUND 1/2 X4 (GAUZE/BANDAGES/DRESSINGS) ×1
COVER MAYO STAND STRL (DRAPES) ×3 IMPLANT
COVER SURGICAL LIGHT HANDLE (MISCELLANEOUS) ×3 IMPLANT
DRAPE C-ARM 42X72 X-RAY (DRAPES) ×3 IMPLANT
DRSG TEGADERM 2-3/8X2-3/4 SM (GAUZE/BANDAGES/DRESSINGS) ×3 IMPLANT
DRSG TEGADERM 4X4.75 (GAUZE/BANDAGES/DRESSINGS) ×3 IMPLANT
ELECT REM PT RETURN 9FT ADLT (ELECTROSURGICAL) ×3
ELECTRODE REM PT RTRN 9FT ADLT (ELECTROSURGICAL) ×1 IMPLANT
FILTER SMOKE EVAC LAPAROSHD (FILTER) ×3 IMPLANT
GAUZE SPONGE 2X2 8PLY STRL LF (GAUZE/BANDAGES/DRESSINGS) ×1 IMPLANT
GLOVE BIO SURGEON STRL SZ7 (GLOVE) ×3 IMPLANT
GLOVE BIOGEL PI IND STRL 7.5 (GLOVE) ×1 IMPLANT
GLOVE BIOGEL PI INDICATOR 7.5 (GLOVE) ×2
GOWN STRL REUS W/ TWL LRG LVL3 (GOWN DISPOSABLE) ×3 IMPLANT
GOWN STRL REUS W/TWL LRG LVL3 (GOWN DISPOSABLE) ×6
HEMOSTAT SNOW SURGICEL 2X4 (HEMOSTASIS) ×3 IMPLANT
KIT BASIN OR (CUSTOM PROCEDURE TRAY) ×3 IMPLANT
KIT TURNOVER KIT B (KITS) ×3 IMPLANT
NS IRRIG 1000ML POUR BTL (IV SOLUTION) ×3 IMPLANT
PAD ARMBOARD 7.5X6 YLW CONV (MISCELLANEOUS) ×3 IMPLANT
POUCH RETRIEVAL ECOSAC 10 (ENDOMECHANICALS) IMPLANT
POUCH RETRIEVAL ECOSAC 10MM (ENDOMECHANICALS)
POUCH SPECIMEN RETRIEVAL 10MM (ENDOMECHANICALS) IMPLANT
SCISSORS LAP 5X35 DISP (ENDOMECHANICALS) ×3 IMPLANT
SET CHOLANGIOGRAPH 5 50 .035 (SET/KITS/TRAYS/PACK) ×3 IMPLANT
SET IRRIG TUBING LAPAROSCOPIC (IRRIGATION / IRRIGATOR) ×3 IMPLANT
SLEEVE ENDOPATH XCEL 5M (ENDOMECHANICALS) ×3 IMPLANT
SPECIMEN JAR SMALL (MISCELLANEOUS) ×3 IMPLANT
SPONGE GAUZE 2X2 STER 10/PKG (GAUZE/BANDAGES/DRESSINGS) ×2
STRIP CLOSURE SKIN 1/2X4 (GAUZE/BANDAGES/DRESSINGS) ×2 IMPLANT
SUT MNCRL AB 4-0 PS2 18 (SUTURE) ×3 IMPLANT
TOWEL OR 17X24 6PK STRL BLUE (TOWEL DISPOSABLE) ×3 IMPLANT
TOWEL OR 17X26 10 PK STRL BLUE (TOWEL DISPOSABLE) ×3 IMPLANT
TRAY LAPAROSCOPIC MC (CUSTOM PROCEDURE TRAY) ×3 IMPLANT
TROCAR XCEL BLUNT TIP 100MML (ENDOMECHANICALS) ×3 IMPLANT
TROCAR XCEL NON-BLD 11X100MML (ENDOMECHANICALS) ×3 IMPLANT
TROCAR XCEL NON-BLD 5MMX100MML (ENDOMECHANICALS) ×3 IMPLANT
TUBING INSUFFLATION (TUBING) ×3 IMPLANT
WATER STERILE IRR 1000ML POUR (IV SOLUTION) ×3 IMPLANT

## 2017-09-03 NOTE — Transfer of Care (Signed)
Immediate Anesthesia Transfer of Care Note  Patient: Dennis Nunez  Procedure(s) Performed: LAPAROSCOPIC CHOLECYSTECTOMY WITH INTRAOPERATIVE CHOLANGIOGRAM (N/A Abdomen)  Patient Location: PACU  Anesthesia Type:General  Level of Consciousness: awake, alert  and patient cooperative  Airway & Oxygen Therapy: Patient Spontanous Breathing and Patient connected to nasal cannula oxygen  Post-op Assessment: Report given to RN and Post -op Vital signs reviewed and stable  Post vital signs: Reviewed and stable  Last Vitals:  Vitals Value Taken Time  BP 132/68 09/03/2017  1:40 PM  Temp 36.6 C 09/03/2017  1:37 PM  Pulse 89 09/03/2017  1:41 PM  Resp 27 09/03/2017  1:41 PM  SpO2 83 % 09/03/2017  1:41 PM  Vitals shown include unvalidated device data.  Last Pain:  Vitals:   09/03/17 0553  TempSrc: Oral  PainSc:          Complications: No apparent anesthesia complications

## 2017-09-03 NOTE — Progress Notes (Signed)
   Subjective/Chief Complaint: Decreased abdominal tenderness BM yesterday NPO now   Objective: Vital signs in last 24 hours: Temp:  [98.7 F (37.1 C)-99.7 F (37.6 C)] 98.7 F (37.1 C) (08/31 0553) Pulse Rate:  [74-87] 74 (08/31 0553) Resp:  [18-20] 20 (08/31 0553) BP: (115-137)/(59-66) 137/66 (08/31 0553) SpO2:  [94 %-96 %] 94 % (08/31 0553) Last BM Date: 09/02/17  Intake/Output from previous day: 08/30 0701 - 08/31 0700 In: 673.7 [I.V.:673.7] Out: 1225 [Urine:1225] Intake/Output this shift: No intake/output data recorded.  General appearance: alert, cooperative and no distress Eyes: no scleral icterus GI: obese, soft, minimal epigastric tenderness Skin: No jaundice  Lab Results:  Recent Labs    09/03/17 0023 09/03/17 0505  WBC 13.7* 13.8*  HGB 10.7* 10.4*  HCT 33.5* 32.6*  PLT 103* 100*   BMET Recent Labs    09/03/17 0023 09/03/17 0505  NA 141 140  K 3.7 3.5  CL 108 105  CO2 26 26  GLUCOSE 103* 99  BUN 21 20  CREATININE 1.73* 1.66*  CALCIUM 8.1* 7.8*   PT/INR Recent Labs    09/01/17 1223  LABPROT 15.6*  INR 1.25   Hepatic Function Latest Ref Rng & Units 09/03/2017 09/03/2017 09/02/2017  Total Protein 6.5 - 8.1 g/dL 5.7(L) 5.8(L) 5.8(L)  Albumin 3.5 - 5.0 g/dL 2.8(L) 2.9(L) 3.1(L)  AST 15 - 41 U/L 25 28 46(H)  ALT 0 - 44 U/L 46(H) 52(H) 76(H)  Alk Phosphatase 38 - 126 U/L 62 63 59  Total Bilirubin 0.3 - 1.2 mg/dL 1.1 1.1 0.8   Lab Results  Component Value Date   LIPASE 52 (H) 09/03/2017   ABG No results for input(s): PHART, HCO3 in the last 72 hours.  Invalid input(s): PCO2, PO2  Studies/Results: No results found.  Anti-infectives: Anti-infectives (From admission, onward)   None      Assessment/Plan: Type 2 Diabetes Mellitus  CKD stageIII Atrial fibrillation  CHF  Gallstone pancreatitis - lipase reportedly 41,000 on admission, 952 > 197 >52 -  - Tx pancreatitis with IVF and NPO, - will plan laparoscopic  cholecystectomy with possible cholangiogram today.  The surgical procedure has been discussed with the patient.  Potential risks, benefits, alternative treatments, and expected outcomes have been explained.  All of the patient's questions at this time have been answered.  The likelihood of reaching the patient's treatment goal is good.  The patient understand the proposed surgical procedure and wishes to proceed.    FEN:NPO w/ ice, IVF VTE: SCDs ID:no current abx  LOS: 2 days    Dennis Nunez 09/03/2017

## 2017-09-03 NOTE — Anesthesia Postprocedure Evaluation (Signed)
Anesthesia Post Note  Patient: Dennis Nunez  Procedure(s) Performed: LAPAROSCOPIC CHOLECYSTECTOMY WITH INTRAOPERATIVE CHOLANGIOGRAM (N/A Abdomen)     Patient location during evaluation: PACU Anesthesia Type: General Level of consciousness: awake and alert, awake and oriented Pain management: pain level controlled Vital Signs Assessment: post-procedure vital signs reviewed and stable Respiratory status: spontaneous breathing, nonlabored ventilation and respiratory function stable Cardiovascular status: blood pressure returned to baseline and stable Postop Assessment: no apparent nausea or vomiting Anesthetic complications: no    Last Vitals:  Vitals:   09/03/17 1355 09/03/17 2016  BP: 137/79 (!) 104/57  Pulse: 73 60  Resp: 20   Temp:  37.2 C  SpO2: 98% 96%    Last Pain:  Vitals:   09/03/17 2038  TempSrc:   PainSc: 0-No pain                 Catalina Gravel

## 2017-09-03 NOTE — Progress Notes (Signed)
Patient Demographics:    Dennis Nunez, is a 72 y.o. male, DOB - 1945/09/02, KCL:[id number]  Admit date - 09/01/2017   Admitting Physician Karmen Bongo, MD  Outpatient Primary MD for the patient is Dennis Nunez, Dennis Cornea, MD  LOS - 2   No chief complaint on file.       Subjective:    Dennis Nunez today has  No chest pain, anxious about lap chole  Assessment  & Plan :    Principal Problem:   Pancreatitis Active Problems:   Atrial fibrillation, chronic (HCC)   Chronic diastolic CHF (congestive heart failure) (HCC)   CKD (chronic kidney disease), stage III (HCC)   Dyslipidemia   Controlled diabetes mellitus ([place])    1)Gallstone Pancreatitis --GI surgical consult appreciated, patient is for possible laparoscopic cholecystectomy with IOC on 09/03/17, lipase continued to trend down, the patient apparently was over 41 k at outside facility, initial lipase here was 952 down to 52 now  2)H/o PAFib---  CHA2DS2- VASc score   is = 4 (age, HTN, DM, CHF)  Which is  equal to =  4.8 % annual risk of stroke , patient will need to discuss full anticoagulation with PCP in the postop unit/post discharge, no need for anticoagulation at this time due to pending lap chole, rate appears controlled, continue digoxin 125 mcg daily   3)DM2-last A1c 6.0, continue reduced dose Lantus at 15 units nightly from Lantus insulin 34 units qhs, until oral intake is better,  use Novolog/Humalog Sliding scale insulin with Accu-Cheks/Fingersticks as ordered   4)HTN--- BP was low at outside facility, okay to continue to hold lisinopril, c/n metoprolol 25 mg daily  5)h/o CHF--- previous echo not available, unable to determine systolic versus diastolic, overall patient does not appear to be in florid heart failure or volume overload status at this time, expectant management at this time  6)AKI----acute kidney injury on CKD stage - ??? III  (old  records unavailablea) ,    creatinine on admission= 2.5  ,   baseline creatinine = ????   , creatinine is now= 1.66     , renally adjust medications, avoid nephrotoxic agents/dehydration/hypotension, admission creatinine was 2.5, creatinine down to 1.66 with hydration   Disposition/Need for in-Hospital Stay- patient unable to be discharged at this time due to acute kidney injury superimposed on CKD, acute gallstone pancreatitis,  laparoscopic cholecystectomy on 09/03/17, patient is currently n.p.o. requiring IV fluids  Code Status : Full   Disposition Plan  : TBD  Consults  :  Gi/Gen surg   DVT Prophylaxis  :    SCDs   Lab Results  Component Value Date   PLT 100 (L) 09/03/2017    Inpatient Medications  Scheduled Meds: . [MAR Hold] digoxin  125 mcg Oral Daily  . [MAR Hold] insulin aspart  0-15 Units Subcutaneous TID WC  . [MAR Hold] insulin aspart  0-5 Units Subcutaneous QHS  . [MAR Hold] insulin glargine  15 Units Subcutaneous QHS  . [MAR Hold] metoprolol succinate  25 mg Oral Daily  . [MAR Hold] mupirocin ointment   Nasal BID   Continuous Infusions: . lactated ringers Stopped (09/03/17 1213)   PRN Meds:.[MAR Hold] acetaminophen **OR** [MAR Hold] acetaminophen, [MAR Hold]  morphine injection, [MAR  Hold] ondansetron **OR** [MAR Hold] ondansetron (ZOFRAN) IV    Anti-infectives (From admission, onward)   Start     Dose/Rate Route Frequency Ordered Stop   09/03/17 0830  ciprofloxacin (CIPRO) IVPB 400 mg     400 mg 200 mL/hr over 60 Minutes Intravenous  Once 09/03/17 0817 09/03/17 1838        Objective:   Vitals:   09/03/17 0553 09/03/17 1337 09/03/17 1345 09/03/17 1355  BP: 137/66 132/68 121/62 137/79  Pulse: 74 94 86 73  Resp: 20 20 20 20   Temp: 98.7 F (37.1 C) 97.9 F (36.6 C)    TempSrc: Oral     SpO2: 94% 94% 96% 98%  Weight:      Height:        Wt Readings from Last 3 Encounters:  09/01/17 126.4 kg     Intake/Output Summary (Last 24 hours) at  09/03/2017 1930 Last data filed at 09/03/2017 1700 Gross per 24 hour  Intake 684.01 ml  Output 1225 ml  Net -540.99 ml     Physical Exam  Patient is examined daily including today on 09/03/17  , exams remain the same as of yesterday except that has changed    Gen:- Awake Alert, in no apparent distress , obese HEENT:- [place].AT,   poor dentition Neck-Supple Neck,No JVD,.  Lungs-  CTAB, fair symmetrical air movement CV- S1, S2 normal , irregular Abd-  +ve B.Sounds, Abd Soft, epigastric and right and left upper quadrant area tenderness without rebound  Extremity/Skin:- No  edema,   good pulses Psych-affect is appropriate, oriented x3 Neuro-no new focal deficits, no tremors   Data Review:   Micro Results Recent Results (from the past 240 hour(s))  Surgical pcr screen     Status: Abnormal   Collection Time: 09/03/17  6:57 AM  Result Value Ref Range Status   MRSA, PCR NEGATIVE NEGATIVE Final   Staphylococcus aureus POSITIVE (A) NEGATIVE Final    Comment: (NOTE) The Xpert SA Assay (FDA approved for NASAL specimens in patients 46 years of age and older), is one component of a comprehensive surveillance program. It is not intended to diagnose infection nor to guide or monitor treatment. Performed at [facility], [place] [address], [place], [place] [address]     Radiology Reports Dg Cholangiogram Operative  Result Date: 09/03/2017 CLINICAL DATA:  72 year old male undergoing cholecystectomy EXAM: INTRAOPERATIVE CHOLANGIOGRAM TECHNIQUE: Cholangiographic images from the C-arm fluoroscopic device were submitted for interpretation post-operatively. Please see the procedural report for the amount of contrast and the fluoroscopy time utilized. COMPARISON:  None. FINDINGS: A cine clip and spot image were obtained during intraoperative cholangiogram at the time of laparoscopic cholecystectomy. The images demonstrate cannulation of the cystic duct remanent and opacification of the biliary  tree. No evidence of biliary duct dilatation, stenosis, stricture or choledocholithiasis. Contrast material refluxes into the main pancreatic duct and passes through the ampulla into the duodenum. IMPRESSION: Negative intraoperative cholangiogram. Electronically Signed   By: Jacqulynn Cadet M.D.   On: 09/03/2017 13:34     CBC Recent Labs  Lab 09/01/17 1223 09/02/17 0305 09/03/17 0023 09/03/17 0505  WBC 12.0* 12.2* 13.7* 13.8*  HGB 11.0* 10.6* 10.7* 10.4*  HCT 34.5* 32.7* 33.5* 32.6*  PLT 124* 160 103* 100*  MCV 98.9 98.2 97.7 98.2  MCH 31.5 31.8 31.2 31.3  MCHC 31.9 32.4 31.9 31.9  RDW 14.7 14.8 14.7 14.6  LYMPHSABS 1.2  --   --   --   MONOABS 0.6  --   --   --  EOSABS 0.2  --   --   --   BASOSABS 0.0  --   --   --     Chemistries  Recent Labs  Lab 09/01/17 1223 09/02/17 0305 09/03/17 0023 09/03/17 0505  NA 142 140 141 140  K 4.9 4.0 3.7 3.5  CL 112* 109 108 105  CO2 22 24 26 26   GLUCOSE 125* 121* 103* 99  BUN 43* 31* 21 20  CREATININE 2.50* 1.96* 1.73* 1.66*  CALCIUM 7.7* 7.8* 8.1* 7.8*  AST 90* 46* 28 25  ALT 105* 76* 52* 46*  ALKPHOS 67 59 63 62  BILITOT 0.7 0.8 1.1 1.1   ------------------------------------------------------------------------------------------------------------------ Recent Labs    09/01/17 1223  CHOL 108  HDL 23*  LDLCALC 59  TRIG 128  CHOLHDL 4.7    Lab Results  Component Value Date   HGBA1C 6.0 (H) 09/01/2017   ------------------------------------------------------------------------------------------------------------------ No results for input(s): TSH, T4TOTAL, T3FREE, THYROIDAB in the last 72 hours.  Invalid input(s): FREET3 ------------------------------------------------------------------------------------------------------------------ No results for input(s): VITAMINB12, FOLATE, FERRITIN, TIBC, IRON, RETICCTPCT in the last 72 hours.  Coagulation profile Recent Labs  Lab 09/01/17 1223  INR 1.25    No results for  input(s): DDIMER in the last 72 hours.  Cardiac Enzymes No results for input(s): CKMB, TROPONINI, MYOGLOBIN in the last 168 hours.  Invalid input(s): CK ------------------------------------------------------------------------------------------------------------------ No results found for: BNP   Roxan Hockey M.D on 09/03/2017 at 7:30 PM  Pager---[phone] Go to [email or web address] - password [email or web address] for contact info  [facility] - Office  [phone]

## 2017-09-03 NOTE — Op Note (Signed)
Laparoscopic Cholecystectomy with IOC Procedure Note  Indications: This patient presents with gallstone pancreatitis that has improved and will undergo laparoscopic cholecystectomy.    Pre-operative Diagnosis: Gallstone pancreatitis  Post-operative Diagnosis: Same  Surgeon: Maia Petties   Assistants: none  Anesthesia: General endotracheal anesthesia  ASA Class: 2  Procedure Details  The patient was seen again in the Holding Room. The risks, benefits, complications, treatment options, and expected outcomes were discussed with the patient. The possibilities of reaction to medication, pulmonary aspiration, perforation of viscus, bleeding, recurrent infection, finding a normal gallbladder, the need for additional procedures, failure to diagnose a condition, the possible need to convert to an open procedure, and creating a complication requiring transfusion or operation were discussed with the patient. The likelihood of improving the patient's symptoms with return to their baseline status is good.  The patient and/or family concurred with the proposed plan, giving informed consent. The site of surgery properly noted. The patient was taken to Operating Room, identified as Dennis Nunez and the procedure verified as Laparoscopic Cholecystectomy with Intraoperative Cholangiogram. A Time Out was held and the above information confirmed.  Prior to the induction of general anesthesia, antibiotic prophylaxis was administered. General endotracheal anesthesia was then administered and tolerated well. After the induction, the abdomen was prepped with Chloraprep and draped in the sterile fashion. The patient was positioned in the supine position.  Local anesthetic agent was injected into the skin near the umbilicus and an incision made. We dissected down to the abdominal fascia with blunt dissection.  The fascia was incised vertically and we entered the peritoneal cavity bluntly.  A pursestring suture of  0-Vicryl was placed around the fascial opening.  The Hasson cannula was inserted and secured with the stay suture.  Pneumoperitoneum was then created with CO2 and tolerated well without any adverse changes in the patient's vital signs. An 11-mm port was placed in the subxiphoid position.  Two 5-mm ports were placed in the right upper quadrant. All skin incisions were infiltrated with a local anesthetic agent before making the incision and placing the trocars.   We positioned the patient in reverse Trendelenburg, tilted slightly to the patient's left.  There is some free fluid in the right upper quadrant of the abdomen.  The gallbladder was identified, the fundus grasped and retracted cephalad. Adhesions were lysed bluntly and with the electrocautery where indicated, taking care not to injure any adjacent organs or viscus. The infundibulum was grasped and retracted laterally, exposing the peritoneum overlying the triangle of Calot. This was then divided and exposed in a blunt fashion. A critical view of the cystic duct and cystic artery was obtained.  The cystic duct was clearly identified and bluntly dissected circumferentially. The cystic duct was ligated with a clip distally.   An incision was made in the cystic duct and the [facility] cholangiogram catheter introduced. The catheter was secured using a clip. A cholangiogram was then obtained which showed good visualization of the distal and proximal biliary tree with no sign of filling defects or obstruction.  Contrast flowed easily into the duodenum. The catheter was then removed.   The cystic duct was then ligated with clips and divided. The cystic artery was identified, dissected free, ligated with clips and divided as well.   The gallbladder was dissected from the liver bed in retrograde fashion with the electrocautery. The gallbladder was removed and placed in an Endocatch sac. The liver bed was irrigated and inspected. Hemostasis was achieved with the  electrocautery.  Copious irrigation was utilized and was repeatedly aspirated until clear.  The gallbladder and Endocatch sac were then removed through the umbilical port site.  The pursestring suture was used to close the umbilical fascia.    We again inspected the right upper quadrant for hemostasis.  Pneumoperitoneum was released as we removed the trocars.  4-0 Monocryl was used to close the skin.   Benzoin, steri-strips, and clean dressings were applied. The patient was then extubated and brought to the recovery room in stable condition. Instrument, sponge, and needle counts were correct at closure and at the conclusion of the case.   Findings: Cholecystitis with Cholelithiasis  Estimated Blood Loss: Minimal         Drains: none         Specimens: Gallbladder           Complications: None; patient tolerated the procedure well.         Disposition: PACU - hemodynamically stable.         Condition: stable  Dennis Nunez. Dennis Dover, MD, [facility] Surgery  General/ Trauma Surgery Beeper [phone]  09/03/2017 1:28 PM

## 2017-09-03 NOTE — Anesthesia Preprocedure Evaluation (Signed)
Anesthesia Evaluation  Patient identified by MRN, date of birth, ID band Patient awake    Reviewed: Allergy & Precautions, NPO status , Patient's Chart, lab work & pertinent test results  Airway Mallampati: II  TM Distance: >3 FB Neck ROM: Full    Dental  (+) Teeth Intact, Dental Advisory Given   Pulmonary neg pulmonary ROS,    Pulmonary exam normal breath sounds clear to auscultation       Cardiovascular +CHF  + dysrhythmias Atrial Fibrillation  Rhythm:Irregular Rate:Normal     Neuro/Psych negative neurological ROS     GI/Hepatic negative GI ROS, Gallbladder; cholecystitis   Endo/Other  diabetes, Type 2Obesity   Renal/GU Renal InsufficiencyRenal disease     Musculoskeletal negative musculoskeletal ROS (+)   Abdominal   Peds  Hematology  (+) Blood dyscrasia (Thrombocytopenia), anemia ,   Anesthesia Other Findings Day of surgery medications reviewed with the patient.  Reproductive/Obstetrics                            Anesthesia Physical Anesthesia Plan  ASA: III  Anesthesia Plan: General   Post-op Pain Management:    Induction: Intravenous  PONV Risk Score and Plan: 4 or greater and Dexamethasone, Ondansetron and Treatment may vary due to age or medical condition  Airway Management Planned: Oral ETT  Additional Equipment:   Intra-op Plan:   Post-operative Plan: Extubation in OR  Informed Consent: I have reviewed the patients History and Physical, chart, labs and discussed the procedure including the risks, benefits and alternatives for the proposed anesthesia with the patient or authorized representative who has indicated his/her understanding and acceptance.   Dental advisory given  Plan Discussed with: CRNA  Anesthesia Plan Comments:         Anesthesia Quick Evaluation

## 2017-09-03 NOTE — Anesthesia Procedure Notes (Signed)
Procedure Name: Intubation Date/Time: 09/03/2017 12:10 PM Performed by: Bryson Corona, CRNA Pre-anesthesia Checklist: Patient identified, Emergency Drugs available, Suction available and Patient being monitored Patient Re-evaluated:Patient Re-evaluated prior to induction Oxygen Delivery Method: Circle System Utilized Preoxygenation: Pre-oxygenation with 100% oxygen Induction Type: IV induction Ventilation: Oral airway inserted - appropriate to patient size Laryngoscope Size: Mac and 4 Grade View: Grade II Tube type: Oral Tube size: 7.0 mm Number of attempts: 2 Airway Equipment and Method: Stylet and Oral airway Placement Confirmation: ETT inserted through vocal cords under direct vision,  positive ETCO2 and breath sounds checked- equal and bilateral Secured at: 23 cm Tube secured with: Tape Dental Injury: Teeth and Oropharynx as per pre-operative assessment  Comments: DL x 2 due to ETT popping out of trachea when stylet was removed.

## 2017-09-04 ENCOUNTER — Encounter (HOSPITAL_COMMUNITY): Payer: Self-pay | Admitting: Surgery

## 2017-09-04 DIAGNOSIS — I5032 Chronic diastolic (congestive) heart failure: Secondary | ICD-10-CM

## 2017-09-04 LAB — COMPREHENSIVE METABOLIC PANEL
ALBUMIN: 2.6 g/dL — AB (ref 3.5–5.0)
ALK PHOS: 71 U/L (ref 38–126)
ALT: 66 U/L — AB (ref 0–44)
AST: 59 U/L — AB (ref 15–41)
Anion gap: 7 (ref 5–15)
BILIRUBIN TOTAL: 0.9 mg/dL (ref 0.3–1.2)
BUN: 21 mg/dL (ref 8–23)
CALCIUM: 7.8 mg/dL — AB (ref 8.9–10.3)
CO2: 25 mmol/L (ref 22–32)
CREATININE: 1.55 mg/dL — AB (ref 0.61–1.24)
Chloride: 108 mmol/L (ref 98–111)
GFR calc Af Amer: 50 mL/min — ABNORMAL LOW (ref >60)
GFR calc non Af Amer: 43 mL/min — ABNORMAL LOW (ref >60)
GLUCOSE: 141 mg/dL — AB (ref 70–99)
POTASSIUM: 4.1 mmol/L (ref 3.5–5.1)
Sodium: 140 mmol/L (ref 135–145)
TOTAL PROTEIN: 5.8 g/dL — AB (ref 6.5–8.1)

## 2017-09-04 LAB — CBC
HEMATOCRIT: 33.4 % — AB (ref 39.0–52.0)
HEMOGLOBIN: 10.8 g/dL — AB (ref 13.0–17.0)
MCH: 31.4 pg (ref 26.0–34.0)
MCHC: 32.3 g/dL (ref 30.0–36.0)
MCV: 97.1 fL (ref 78.0–100.0)
Platelets: 104 10*3/uL — ABNORMAL LOW (ref 150–400)
RBC: 3.44 MIL/uL — AB (ref 4.22–5.81)
RDW: 14.3 % (ref 11.5–15.5)
WBC: 14.8 10*3/uL — AB (ref 4.0–10.5)

## 2017-09-04 LAB — GLUCOSE, CAPILLARY
GLUCOSE-CAPILLARY: 79 mg/dL (ref 70–99)
Glucose-Capillary: 109 mg/dL — ABNORMAL HIGH (ref 70–99)
Glucose-Capillary: 119 mg/dL — ABNORMAL HIGH (ref 70–99)
Glucose-Capillary: 128 mg/dL — ABNORMAL HIGH (ref 70–99)
Glucose-Capillary: 134 mg/dL — ABNORMAL HIGH (ref 70–99)
Glucose-Capillary: 138 mg/dL — ABNORMAL HIGH (ref 70–99)

## 2017-09-04 NOTE — Progress Notes (Signed)
1 Day Post-Op   Subjective/Chief Complaint: Denies any abdominal pain - feeling much better than preop. Denies n/v on clears.   Objective: Vital signs in last 24 hours: Temp:  [97.5 F (36.4 C)-98.9 F (37.2 C)] 97.7 F (36.5 C) (09/01 0823) Pulse Rate:  [43-94] 45 (09/01 0837) Resp:  [18-20] 18 (09/01 0516) BP: (104-137)/(53-79) 109/53 (09/01 0837) SpO2:  [94 %-99 %] 99 % (09/01 0837) Last BM Date: 09/02/17  Intake/Output from previous day: 08/31 0701 - 09/01 0700 In: 291.9 [I.V.:291.9] Out: 600 [Urine:550; Blood:50] Intake/Output this shift: Total I/O In: 450 [P.O.:450] Out: 550 [Urine:550]  General appearance: alert, cooperative and no distress Eyes: no scleral icterus GI: obese, soft, NT/ND; incisions c/d/i without erythema Skin: No jaundice  Lab Results:  Recent Labs    09/03/17 0505 09/04/17 0013  WBC 13.8* 14.8*  HGB 10.4* 10.8*  HCT 32.6* 33.4*  PLT 100* 104*   BMET Recent Labs    09/03/17 0505 09/04/17 0013  NA 140 140  K 3.5 4.1  CL 105 108  CO2 26 25  GLUCOSE 99 141*  BUN 20 21  CREATININE 1.66* 1.55*  CALCIUM 7.8* 7.8*   PT/INR Recent Labs    09/01/17 1223  LABPROT 15.6*  INR 1.25   Hepatic Function Latest Ref Rng & Units 09/04/2017 09/03/2017 09/03/2017  Total Protein 6.5 - 8.1 g/dL 5.8(L) 5.7(L) 5.8(L)  Albumin 3.5 - 5.0 g/dL 2.6(L) 2.8(L) 2.9(L)  AST 15 - 41 U/L 59(H) 25 28  ALT 0 - 44 U/L 66(H) 46(H) 52(H)  Alk Phosphatase 38 - 126 U/L 71 62 63  Total Bilirubin 0.3 - 1.2 mg/dL 0.9 1.1 1.1   Lab Results  Component Value Date   LIPASE 52 (H) 09/03/2017   ABG No results for input(s): PHART, HCO3 in the last 72 hours.  Invalid input(s): PCO2, PO2  Studies/Results: Dg Cholangiogram Operative  Result Date: 09/03/2017 CLINICAL DATA:  72 year old male undergoing cholecystectomy EXAM: INTRAOPERATIVE CHOLANGIOGRAM TECHNIQUE: Cholangiographic images from the C-arm fluoroscopic device were submitted for interpretation post-operatively.  Please see the procedural report for the amount of contrast and the fluoroscopy time utilized. COMPARISON:  None. FINDINGS: A cine clip and spot image were obtained during intraoperative cholangiogram at the time of laparoscopic cholecystectomy. The images demonstrate cannulation of the cystic duct remanent and opacification of the biliary tree. No evidence of biliary duct dilatation, stenosis, stricture or choledocholithiasis. Contrast material refluxes into the main pancreatic duct and passes through the ampulla into the duodenum. IMPRESSION: Negative intraoperative cholangiogram. Electronically Signed   By: Jacqulynn Cadet M.D.   On: 09/03/2017 13:34    Anti-infectives: Anti-infectives (From admission, onward)   Start     Dose/Rate Route Frequency Ordered Stop   09/03/17 0830  ciprofloxacin (CIPRO) IVPB 400 mg     400 mg 200 mL/hr over 60 Minutes Intravenous  Once 09/03/17 0817 09/03/17 1838      Assessment/Plan: Type 2 Diabetes Mellitus  CKD stageIII Atrial fibrillation  CHF  Gallstone pancreatitis s/p lap chole 09/03/17 (Dr. Georgette Dover)  - Advance to regular/diabetic diet as tolerated - Ambulate 5x/day; IS 10x/hr while awake - SCDs, ok for chemical dvt prophylaxis from our standpoint - Possible discharge tomorrow if feeling well and tolerating diet  VTE: SCDs   LOS: 3 days   Ileana Roup 09/04/2017

## 2017-09-04 NOTE — Progress Notes (Signed)
Patient Demographics:    Dennis Nunez, is a 72 y.o. male, DOB - [birth year], EOF:[id number]  Admit date - 09/01/2017   Admitting Physician Karmen Bongo, MD  Outpatient Primary MD for the patient is Dennis Nunez, Dennis Cornea, MD  LOS - 3   No chief complaint on file.       Subjective:    Dennis Nunez today has  No chest pain, tolerating clear liquid diet well,  Assessment  & Plan :    Principal Problem:   Pancreatitis Active Problems:   Atrial fibrillation, chronic (HCC)   Chronic diastolic CHF (congestive heart failure) (HCC)   CKD (chronic kidney disease), stage III (HCC)   Dyslipidemia   Controlled diabetes mellitus ([place])  Brief summary 72 y.o. male with medical history significant of DM, Afib  and CKD (unspecified) presented as a transfer from [facility] with gallstone pancreatitis on 09/01/2017  Plan:- 1)Gallstone Pancreatitis --GI surgical consult appreciated, s/p  laparoscopic cholecystectomy with IOC on 09/03/17,   the patient apparently was over 41 k at outside facility, initial lipase here was 952 down to 68 now, clinically much improved, leukocytosis persist however  2)H/o PAFib---  CHA2DS2- VASc score   is = 4 (age, HTN, DM, CHF)  Which is  equal to =  4.8 % annual risk of stroke ,   continue digoxin 125 mcg daily and metoprolol for rate control with hold parameters, patient will discuss anticoagulation with PCP post discharge   3)DM2-last A1c 6.0, continue reduced dose Lantus at 15 units nightly from Lantus insulin 34 units qhs, until oral intake is better,  use Novolog/Humalog Sliding scale insulin with Accu-Cheks/Fingersticks as ordered   4)HTN--- BP was low at outside facility, okay to continue to hold lisinopril, c/n metoprolol 25 mg daily with hold parameters  5)h/o CHF--- previous echo not available, unable to determine systolic versus diastolic, overall patient does not appear to  be in florid heart failure or volume overload status at this time, expectant management at this time  6)AKI----acute kidney injury on CKD stage - ??? III  (old records unavailablea) ,    creatinine on admission= 2.5  ,   baseline creatinine = ????   , creatinine is now= 1.55     , renally adjust medications, avoid nephrotoxic agents/dehydration/hypotension, admission creatinine was 2.5, creatinine down to 1.55 with hydration   Disposition/Need for in-Hospital Stay- patient unable to be discharged at this time due to acute kidney injury superimposed on CKD, acute gallstone pancreatitis,  S/p laparoscopic cholecystectomy on 09/03/17, patient is currently only on clear liquids,  Code Status : Full   Disposition Plan  : TBD  Consults  :  Gi/Gen surg   DVT Prophylaxis  :    SCDs   Lab Results  Component Value Date   PLT 104 (L) 09/04/2017    Inpatient Medications  Scheduled Meds: . digoxin  125 mcg Oral Daily  . insulin aspart  0-15 Units Subcutaneous TID WC  . insulin aspart  0-5 Units Subcutaneous QHS  . insulin glargine  15 Units Subcutaneous QHS  . metoprolol succinate  25 mg Oral Daily  . mupirocin ointment   Nasal BID   Continuous Infusions: . lactated ringers 100 mL/hr at 09/04/17 1058   PRN  Meds:.acetaminophen **OR** acetaminophen, morphine injection, ondansetron **OR** ondansetron (ZOFRAN) IV    Anti-infectives (From admission, onward)   Start     Dose/Rate Route Frequency Ordered Stop   09/03/17 0830  ciprofloxacin (CIPRO) IVPB 400 mg     400 mg 200 mL/hr over 60 Minutes Intravenous  Once 09/03/17 0817 09/03/17 1838        Objective:   Vitals:   09/04/17 0516 09/04/17 0823 09/04/17 0836 09/04/17 0837  BP: 106/66   (!) 109/53  Pulse: (!) 43  (!) 48 (!) 45  Resp: 18     Temp: (!) 97.5 F (36.4 C) 97.7 F (36.5 C)    TempSrc:  Oral    SpO2: 99%   99%  Weight:      Height:        Wt Readings from Last 3 Encounters:  09/01/17 126.4 kg      Intake/Output Summary (Last 24 hours) at 09/04/2017 1636 Last data filed at 09/04/2017 0929 Gross per 24 hour  Intake 741.91 ml  Output 1100 ml  Net -358.09 ml     Physical Exam  Patient is examined daily including today on 09/04/17  , exams remain the same as of yesterday except that has changed    Gen:- Awake Alert, in no apparent distress , obese HEENT:- [place].AT,   poor dentition Neck-Supple Neck,No JVD,.  Lungs-  CTAB, fair symmetrical air movement CV- S1, S2 normal , irregular Abd-  +ve B.Sounds, Abd Soft, epigastric and right and left upper quadrant area with appropriate postop tenderness  without rebound  Extremity/Skin:- No  edema,   good pulses Psych-affect is appropriate, oriented x3 Neuro-no new focal deficits, no tremors   Data Review:   Micro Results Recent Results (from the past 240 hour(s))  Surgical pcr screen     Status: Abnormal   Collection Time: 09/03/17  6:57 AM  Result Value Ref Range Status   MRSA, PCR NEGATIVE NEGATIVE Final   Staphylococcus aureus POSITIVE (A) NEGATIVE Final    Comment: (NOTE) The Xpert SA Assay (FDA approved for NASAL specimens in patients 69 years of age and older), is one component of a comprehensive surveillance program. It is not intended to diagnose infection nor to guide or monitor treatment. Performed at [facility], [place] [address] [place], [place] [address]     Radiology Reports Dg Cholangiogram Operative  Result Date: 09/03/2017 CLINICAL DATA:  72 year old male undergoing cholecystectomy EXAM: INTRAOPERATIVE CHOLANGIOGRAM TECHNIQUE: Cholangiographic images from the C-arm fluoroscopic device were submitted for interpretation post-operatively. Please see the procedural report for the amount of contrast and the fluoroscopy time utilized. COMPARISON:  None. FINDINGS: A cine clip and spot image were obtained during intraoperative cholangiogram at the time of laparoscopic cholecystectomy. The images demonstrate  cannulation of the cystic duct remanent and opacification of the biliary tree. No evidence of biliary duct dilatation, stenosis, stricture or choledocholithiasis. Contrast material refluxes into the main pancreatic duct and passes through the ampulla into the duodenum. IMPRESSION: Negative intraoperative cholangiogram. Electronically Signed   By: Jacqulynn Cadet M.D.   On: 09/03/2017 13:34     CBC Recent Labs  Lab 09/01/17 1223 09/02/17 0305 09/03/17 0023 09/03/17 0505 09/04/17 0013  WBC 12.0* 12.2* 13.7* 13.8* 14.8*  HGB 11.0* 10.6* 10.7* 10.4* 10.8*  HCT 34.5* 32.7* 33.5* 32.6* 33.4*  PLT 124* 160 103* 100* 104*  MCV 98.9 98.2 97.7 98.2 97.1  MCH 31.5 31.8 31.2 31.3 31.4  MCHC 31.9 32.4 31.9 31.9 32.3  RDW 14.7 14.8 14.7 14.6 14.3  LYMPHSABS 1.2  --   --   --   --   MONOABS 0.6  --   --   --   --   EOSABS 0.2  --   --   --   --   BASOSABS 0.0  --   --   --   --     Chemistries  Recent Labs  Lab 09/01/17 1223 09/02/17 0305 09/03/17 0023 09/03/17 0505 09/04/17 0013  NA 142 140 141 140 140  K 4.9 4.0 3.7 3.5 4.1  CL 112* 109 108 105 108  CO2 22 24 26 26 25   GLUCOSE 125* 121* 103* 99 141*  BUN 43* 31* 21 20 21   CREATININE 2.50* 1.96* 1.73* 1.66* 1.55*  CALCIUM 7.7* 7.8* 8.1* 7.8* 7.8*  AST 90* 46* 28 25 59*  ALT 105* 76* 52* 46* 66*  ALKPHOS 67 59 63 62 71  BILITOT 0.7 0.8 1.1 1.1 0.9   ------------------------------------------------------------------------------------------------------------------ No results for input(s): CHOL, HDL, LDLCALC, TRIG, CHOLHDL, LDLDIRECT in the last 72 hours.  Lab Results  Component Value Date   HGBA1C 6.0 (H) 09/01/2017   ------------------------------------------------------------------------------------------------------------------ No results for input(s): TSH, T4TOTAL, T3FREE, THYROIDAB in the last 72 hours.  Invalid input(s):  FREET3 ------------------------------------------------------------------------------------------------------------------ No results for input(s): VITAMINB12, FOLATE, FERRITIN, TIBC, IRON, RETICCTPCT in the last 72 hours.  Coagulation profile Recent Labs  Lab 09/01/17 1223  INR 1.25    No results for input(s): DDIMER in the last 72 hours.  Cardiac Enzymes No results for input(s): CKMB, TROPONINI, MYOGLOBIN in the last 168 hours.  Invalid input(s): CK ------------------------------------------------------------------------------------------------------------------ No results found for: BNP   Roxan Hockey M.D on 09/04/2017 at 4:36 PM  Pager---[phone] Go to [email or web address] - password [email or web address] for contact info  [facility] - Office  [phone]

## 2017-09-05 LAB — COMPREHENSIVE METABOLIC PANEL
ALBUMIN: 2.8 g/dL — AB (ref 3.5–5.0)
ALT: 61 U/L — ABNORMAL HIGH (ref 0–44)
ANION GAP: 6 (ref 5–15)
AST: 42 U/L — ABNORMAL HIGH (ref 15–41)
Alkaline Phosphatase: 73 U/L (ref 38–126)
BUN: 23 mg/dL (ref 8–23)
CO2: 28 mmol/L (ref 22–32)
Calcium: 8 mg/dL — ABNORMAL LOW (ref 8.9–10.3)
Chloride: 108 mmol/L (ref 98–111)
Creatinine, Ser: 1.43 mg/dL — ABNORMAL HIGH (ref 0.61–1.24)
GFR calc non Af Amer: 47 mL/min — ABNORMAL LOW (ref >60)
GFR, EST AFRICAN AMERICAN: 55 mL/min — AB (ref >60)
GLUCOSE: 103 mg/dL — AB (ref 70–99)
POTASSIUM: 3.8 mmol/L (ref 3.5–5.1)
SODIUM: 142 mmol/L (ref 135–145)
TOTAL PROTEIN: 6 g/dL — AB (ref 6.5–8.1)
Total Bilirubin: 0.7 mg/dL (ref 0.3–1.2)

## 2017-09-05 LAB — CBC
HCT: 34.5 % — ABNORMAL LOW (ref 39.0–52.0)
Hemoglobin: 11.3 g/dL — ABNORMAL LOW (ref 13.0–17.0)
MCH: 31.9 pg (ref 26.0–34.0)
MCHC: 32.8 g/dL (ref 30.0–36.0)
MCV: 97.5 fL (ref 78.0–100.0)
Platelets: 123 10*3/uL — ABNORMAL LOW (ref 150–400)
RBC: 3.54 MIL/uL — ABNORMAL LOW (ref 4.22–5.81)
RDW: 14.4 % (ref 11.5–15.5)
WBC: 12 10*3/uL — ABNORMAL HIGH (ref 4.0–10.5)

## 2017-09-05 LAB — GLUCOSE, CAPILLARY
GLUCOSE-CAPILLARY: 121 mg/dL — AB (ref 70–99)
GLUCOSE-CAPILLARY: 109 mg/dL — AB (ref 70–99)
Glucose-Capillary: 82 mg/dL (ref 70–99)
Glucose-Capillary: 102 mg/dL — ABNORMAL HIGH (ref 70–99)

## 2017-09-05 MED ORDER — HEPARIN SODIUM (PORCINE) 5000 UNIT/ML IJ SOLN
5000.0000 [IU] | Freq: Three times a day (TID) | INTRAMUSCULAR | Status: DC
  Administered 2017-09-05 – 2017-09-06 (×3): 5000 [IU] via SUBCUTANEOUS
  Filled 2017-09-05 (×2): qty 1

## 2017-09-05 NOTE — Care Management Important Message (Signed)
Important Message  Patient Details  Name: Dennis Nunez MRN: [id number] Date of Birth: [birth year]   Medicare Important Message Given:  Yes    Michaelyn Wall [address] 09/05/2017, 3:47 PM

## 2017-09-05 NOTE — Progress Notes (Signed)
2 Days Post-Op   Subjective/Chief Complaint: No abdominal pain Tolerating clear liquids No transportation until tomorrow   Objective: Vital signs in last 24 hours: Temp:  [98 F (36.7 C)-98.2 F (36.8 C)] 98.2 F (36.8 C) (09/02 0606) Pulse Rate:  [61-66] 66 (09/02 0606) Resp:  [16-18] 18 (09/02 0606) BP: (115-124)/(57-67) 124/57 (09/02 0606) SpO2:  [93 %-96 %] 96 % (09/02 0606) Last BM Date: 09/02/17  Intake/Output from previous day: 09/01 0701 - 09/02 0700 In: 2951.8 [P.O.:450; I.V.:2501.8] Out: 925 [Urine:925] Intake/Output this shift: Total I/O In: 360 [P.O.:360] Out: 200 [Urine:200]  Abd - soft, non-tender Dressings removed - incisions c/d/i   Lab Results:  Recent Labs    09/04/17 0013 09/05/17 0449  WBC 14.8* 12.0*  HGB 10.8* 11.3*  HCT 33.4* 34.5*  PLT 104* 123*   BMET Recent Labs    09/04/17 0013 09/05/17 0449  NA 140 142  K 4.1 3.8  CL 108 108  CO2 25 28  GLUCOSE 141* 103*  BUN 21 23  CREATININE 1.55* 1.43*  CALCIUM 7.8* 8.0*   PT/INR No results for input(s): LABPROT, INR in the last 72 hours. ABG No results for input(s): PHART, HCO3 in the last 72 hours.  Invalid input(s): PCO2, PO2  Studies/Results: Dg Cholangiogram Operative  Result Date: 09/03/2017 CLINICAL DATA:  72 year old male undergoing cholecystectomy EXAM: INTRAOPERATIVE CHOLANGIOGRAM TECHNIQUE: Cholangiographic images from the C-arm fluoroscopic device were submitted for interpretation post-operatively. Please see the procedural report for the amount of contrast and the fluoroscopy time utilized. COMPARISON:  None. FINDINGS: A cine clip and spot image were obtained during intraoperative cholangiogram at the time of laparoscopic cholecystectomy. The images demonstrate cannulation of the cystic duct remanent and opacification of the biliary tree. No evidence of biliary duct dilatation, stenosis, stricture or choledocholithiasis. Contrast material refluxes into the main pancreatic  duct and passes through the ampulla into the duodenum. IMPRESSION: Negative intraoperative cholangiogram. Electronically Signed   By: Jacqulynn Cadet M.D.   On: 09/03/2017 13:34    Anti-infectives: Anti-infectives (From admission, onward)   Start     Dose/Rate Route Frequency Ordered Stop   09/03/17 0830  ciprofloxacin (CIPRO) IVPB 400 mg     400 mg 200 mL/hr over 60 Minutes Intravenous  Once 09/03/17 0817 09/03/17 1838      Assessment/Plan: Type 2 Diabetes Mellitus  CKD stageIII Atrial fibrillation  CHF  Gallstone pancreatitis s/p lap chole 09/03/17 (Dr. Georgette Dover)  - Advance to regular/diabetic diet as tolerated - Ambulate 5x/day; IS 10x/hr while awake - SCDs, ok for chemical dvt prophylaxis from our standpoint - Discharge tomorrow if feeling well and tolerating diet   LOS: 4 days    Dennis Nunez 09/05/2017

## 2017-09-05 NOTE — Progress Notes (Signed)
Patient Demographics:    Dennis Nunez, is a 72 y.o. male, DOB - [birth year], ZCH:[id number]  Admit date - 09/01/2017   Admitting Physician Karmen Bongo, MD  Outpatient Primary MD for the patient is [name], [name], MD  LOS - 4   No chief complaint on file.       Subjective:    Dennis Nunez today has  No chest pain, tolerating clear liquid diet well, as per surgical service okay to advance to diabetic diet, patient is passing gas but no BM yet  Assessment  & Plan :    Principal Problem:   Pancreatitis Active Problems:   Atrial fibrillation, chronic (HCC)   Chronic diastolic CHF (congestive heart failure) (HCC)   CKD (chronic kidney disease), stage III ([place])   Dyslipidemia   Controlled diabetes mellitus ([place])  Brief summary 72 y.o. male with medical history significant of DM, Afib  and CKD (unspecified) presented as a transfer from [facility] with gallstone pancreatitis on 09/01/2017, s/p Lap chol on 08/06/17  Plan:- 1)Gallstone Pancreatitis --GI surgical consult appreciated, s/p  laparoscopic cholecystectomy with IOC on 09/03/17,   the patient apparently was over 41 k at outside facility, initial lipase here was 952  , lipase trended down, leukocytosis improving white count is down to 12,000 from 14.8, suspect this may be reactive leukocytosis  2)H/o PAFib---  CHA2DS2- VASc score   is = 4 (age, HTN, DM, CHF)  Which is  equal to =  4.8 % annual risk of stroke ,   continue digoxin 125 mcg daily and metoprolol for rate control with hold parameters, patient will discuss FULL anticoagulation with PCP post discharge   3)DM2-last A1c 6.0, continue reduced dose Lantus at 15 units nightly from Lantus insulin 34 units qhs,   use Novolog/Humalog Sliding scale insulin with Accu-Cheks/Fingersticks as ordered, as per surgical service okay to advance diet to diabetic diet, may need more insulin as oral intake  improves   4)HTN--- BP was low at outside facility, okay to continue to hold lisinopril, c/n metoprolol 25 mg daily with hold parameters  5)h/o CHF--- previous echo not available, unable to determine systolic versus diastolic, overall patient does not appear to be in florid heart failure or volume overload status at this time, expectant management at this time  6)AKI----acute kidney injury on CKD stage - ??? III  (old records unavailablea) ,    creatinine on admission= 2.5  ,   baseline creatinine = ????   , creatinine is now= 1.43     , renally adjust medications, avoid nephrotoxic agents/dehydration/hypotension, admission creatinine was 2.5, creatinine down to 1.43 with hydration.,  Renal function has improved, but is now back to "baseline"   Disposition/Need for in-Hospital Stay- patient unable to be discharged at this time due to acute kidney injury superimposed on CKD, acute gallstone pancreatitis,  S/p laparoscopic cholecystectomy on 09/03/17, possible discharge in a.m. if tolerating solid diet well, leukocytosis and elevated creatinine persist, but both are improving  Code Status : Full   Disposition Plan  : Home  Consults  :  Gi/Gen surg   DVT Prophylaxis  :    SCDs   Lab Results  Component Value Date   PLT 123 (L) 09/05/2017  Inpatient Medications  Scheduled Meds: . digoxin  125 mcg Oral Daily  . heparin injection (subcutaneous)  5,000 Units Subcutaneous Q8H  . insulin aspart  0-15 Units Subcutaneous TID WC  . insulin aspart  0-5 Units Subcutaneous QHS  . insulin glargine  15 Units Subcutaneous QHS  . metoprolol succinate  25 mg Oral Daily  . mupirocin ointment   Nasal BID   Continuous Infusions: . lactated ringers 100 mL/hr at 09/05/17 0657   PRN Meds:.acetaminophen **OR** acetaminophen, morphine injection, ondansetron **OR** ondansetron (ZOFRAN) IV    Anti-infectives (From admission, onward)   Start     Dose/Rate Route Frequency Ordered Stop   09/03/17 0830   ciprofloxacin (CIPRO) IVPB 400 mg     400 mg 200 mL/hr over 60 Minutes Intravenous  Once 09/03/17 0817 09/03/17 1838        Objective:   Vitals:   09/04/17 0837 09/04/17 2116 09/05/17 0606 09/05/17 1405  BP: (!) 109/53 115/67 (!) 124/57 (!) 99/56  Pulse: (!) 45 61 66 78  Resp:  16 18 20   Temp:  98 F (36.7 C) 98.2 F (36.8 C) 99 F (37.2 C)  TempSrc:  Oral Oral Oral  SpO2: 99% 93% 96% 96%  Weight:      Height:        Wt Readings from Last 3 Encounters:  09/01/17 126.4 kg     Intake/Output Summary (Last 24 hours) at 09/05/2017 1816 Last data filed at 09/05/2017 1630 Gross per 24 hour  Intake 4095.97 ml  Output 975 ml  Net 3120.97 ml     Physical Exam  Patient is examined daily including today on 09/05/17  , exams remain the same as of yesterday except that has changed    Gen:- Awake Alert, in no apparent distress , obese HEENT:- [place].AT,   poor dentition Neck-Supple Neck,No JVD,.  Lungs-  CTAB, fair symmetrical air movement CV- S1, S2 normal , irregular Abd-  +ve B.Sounds, Abd Soft, epigastric and right and left upper quadrant area with appropriate postop tenderness  without rebound  Extremity/Skin:- No  edema,   good pulses Psych-affect is appropriate, oriented x3 Neuro-no new focal deficits, no tremors   Data Review:   Micro Results Recent Results (from the past 240 hour(s))  Surgical pcr screen     Status: Abnormal   Collection Time: 09/03/17  6:57 AM  Result Value Ref Range Status   MRSA, PCR NEGATIVE NEGATIVE Final   Staphylococcus aureus POSITIVE (A) NEGATIVE Final    Comment: (NOTE) The Xpert SA Assay (FDA approved for NASAL specimens in patients 47 years of age and older), is one component of a comprehensive surveillance program. It is not intended to diagnose infection nor to guide or monitor treatment. Performed at [facility], [place] [address] [place], [place] [address]     Radiology Reports Dg Cholangiogram Operative  Result Date:  09/03/2017 CLINICAL DATA:  72 year old male undergoing cholecystectomy EXAM: INTRAOPERATIVE CHOLANGIOGRAM TECHNIQUE: Cholangiographic images from the C-arm fluoroscopic device were submitted for interpretation post-operatively. Please see the procedural report for the amount of contrast and the fluoroscopy time utilized. COMPARISON:  None. FINDINGS: A cine clip and spot image were obtained during intraoperative cholangiogram at the time of laparoscopic cholecystectomy. The images demonstrate cannulation of the cystic duct remanent and opacification of the biliary tree. No evidence of biliary duct dilatation, stenosis, stricture or choledocholithiasis. Contrast material refluxes into the main pancreatic duct and passes through the ampulla into the duodenum. IMPRESSION: Negative intraoperative  cholangiogram. Electronically Signed   By: Jacqulynn Cadet M.D.   On: 09/03/2017 13:34     CBC Recent Labs  Lab 09/01/17 1223 09/02/17 0305 09/03/17 0023 09/03/17 0505 09/04/17 0013 09/05/17 0449  WBC 12.0* 12.2* 13.7* 13.8* 14.8* 12.0*  HGB 11.0* 10.6* 10.7* 10.4* 10.8* 11.3*  HCT 34.5* 32.7* 33.5* 32.6* 33.4* 34.5*  PLT 124* 160 103* 100* 104* 123*  MCV 98.9 98.2 97.7 98.2 97.1 97.5  MCH 31.5 31.8 31.2 31.3 31.4 31.9  MCHC 31.9 32.4 31.9 31.9 32.3 32.8  RDW 14.7 14.8 14.7 14.6 14.3 14.4  LYMPHSABS 1.2  --   --   --   --   --   MONOABS 0.6  --   --   --   --   --   EOSABS 0.2  --   --   --   --   --   BASOSABS 0.0  --   --   --   --   --     Chemistries  Recent Labs  Lab 09/02/17 0305 09/03/17 0023 09/03/17 0505 09/04/17 0013 09/05/17 0449  NA 140 141 140 140 142  K 4.0 3.7 3.5 4.1 3.8  CL 109 108 105 108 108  CO2 24 26 26 25 28   GLUCOSE 121* 103* 99 141* 103*  BUN 31* 21 20 21 23   CREATININE 1.96* 1.73* 1.66* 1.55* 1.43*  CALCIUM 7.8* 8.1* 7.8* 7.8* 8.0*  AST 46* 28 25 59* 42*  ALT 76* 52* 46* 66* 61*  ALKPHOS 59 63 62 71 73  BILITOT 0.8 1.1 1.1 0.9 0.7    ------------------------------------------------------------------------------------------------------------------ No results for input(s): CHOL, HDL, LDLCALC, TRIG, CHOLHDL, LDLDIRECT in the last 72 hours.  Lab Results  Component Value Date   HGBA1C 6.0 (H) 09/01/2017   ------------------------------------------------------------------------------------------------------------------ No results for input(s): TSH, T4TOTAL, T3FREE, THYROIDAB in the last 72 hours.  Invalid input(s): FREET3 ------------------------------------------------------------------------------------------------------------------ No results for input(s): VITAMINB12, FOLATE, FERRITIN, TIBC, IRON, RETICCTPCT in the last 72 hours.  Coagulation profile Recent Labs  Lab 09/01/17 1223  INR 1.25    No results for input(s): DDIMER in the last 72 hours.  Cardiac Enzymes No results for input(s): CKMB, TROPONINI, MYOGLOBIN in the last 168 hours.  Invalid input(s): CK ------------------------------------------------------------------------------------------------------------------ No results found for: BNP   Roxan Hockey M.D on 09/05/2017 at 6:16 PM  Pager---[phone] Go to [email or web address] - password [email or web address] for contact info  [facility] - Office  [phone]

## 2017-09-06 DIAGNOSIS — Z23 Encounter for immunization: Secondary | ICD-10-CM | POA: Diagnosis not present

## 2017-09-06 LAB — GLUCOSE, CAPILLARY
GLUCOSE-CAPILLARY: 101 mg/dL — AB (ref 70–99)
Glucose-Capillary: 106 mg/dL — ABNORMAL HIGH (ref 70–99)

## 2017-09-06 MED ORDER — FUROSEMIDE 40 MG PO TABS: 40.0000 mg | ORAL_TABLET | Freq: Two times a day (BID) | ORAL | 3 refills | Status: DC

## 2017-09-06 MED ORDER — LISINOPRIL 40 MG PO TABS: 20.0000 mg | ORAL_TABLET | Freq: Every day | ORAL | 3 refills | Status: DC

## 2017-09-06 NOTE — Progress Notes (Signed)
[name] to be D/C'd home per MD order. Discussed with the patient and all questions fully answered. VVS, Skin clean, dry and intact without evidence of skin break down, no evidence of skin tears noted.  IV catheter discontinued intact. Site without signs and symptoms of complications. Dressing and pressure applied. Stated he wanted to use his ted hose that he had at home. An After Visit Summary was printed and given to the patient.  Patient escorted via [place], and D/C home via private auto.  Melonie [place]  09/06/2017 4:37 PM

## 2017-09-06 NOTE — Discharge Summary (Signed)
Dennis Nunez, is a 72 y.o. male  DOB 03-01-1945  MRN [id number].  Admission date:  09/01/2017  Admitting Physician  Karmen Bongo, MD  Discharge Date:  09/06/2017   Primary MD  [name], [name], MD  Recommendations for primary care physician for things to follow:   1)Avoid ibuprofen/Advil/Aleve/Motrin/Goody Powders/Naproxen/BC powders/Meloxicam/Diclofenac/Indomethacin and other Nonsteroidal anti-inflammatory medications as these will make you more likely to bleed and can cause stomach ulcers, can also cause Kidney problems.  2)Repeat CBC (complete Blood Count)  and CMP (complete metabolic profile) test with your primary care doctor within a week  3) activity limitation as advised by general surgeon  4)follow  up with general surgeon as advised  5)Very low-salt diet advised 6)Weigh yourself daily, call if you gain more than 3 pounds in 1 day or more than 5 pounds in 1 week as your diuretic medications may need to be adjusted 7)Limit your Fluid  intake to no more than 60 ounces (1.8 Liters) per day  Admission Diagnosis  PANCREATITIS   Discharge Diagnosis  PANCREATITIS    Principal Problem:   Pancreatitis Active Problems:   Atrial fibrillation, chronic (HCC)   Chronic diastolic CHF (congestive heart failure) (HCC)   CKD (chronic kidney disease), stage III (HCC)   Dyslipidemia   Controlled diabetes mellitus ([place])      Past Medical History:  Diagnosis Date  . Atrial fibrillation ([place])    takes Dilt but not apparently AC  . CHF (congestive heart failure) ([place])   . CKD (chronic kidney disease)   . Dyslipidemia   . Gout   . Type 2 diabetes mellitus ([place])     Past Surgical History:  Procedure Laterality Date  . CHOLECYSTECTOMY N/A 09/03/2017   Procedure: LAPAROSCOPIC CHOLECYSTECTOMY WITH INTRAOPERATIVE CHOLANGIOGRAM;  Surgeon: Donnie Mesa, MD;  Location: [facility];  Service: General;  Laterality:  N/A;     HPI  from the history and physical done on the day of admission:     Chief Complaint: Abdominal pain  HPI: Dennis Nunez is a 71 y.o. male with medical history significant of DM and CKD (unspecified) presented as a transfer from [facility] with gallstone pancreatitis.  He had a bout with pancreatitis and called 911 because his BP got so low he was staggering and he fell a couple of times and didn't know if he could get up.  He first noticed last night some gas and bloating in his midepigastric region with chest pain.  He went home from church and took Prilosec with some relief.  About 9pm, he started feeling nauseated.  Vomiting x 6.  Then he started sweating profusely.  He continues to have pain in the "pit of my stomach", sore, ongoing anorexia, some nausea.  He has a h/o fast heart beat and he takes medicine for this (?afib) - the next day it would feel sore and that is how his abdomen feels now.  He has had some real bad heartburn a few times previously, but it resolved with Prilosec.  He  never had surgery so he still has a gallbladder.  No fever.   ED Course:   Accepted by Dr. Tamala Julian, who reported: Pmh of DM type II and renal insufficiency; who presented with N/V and epigastric pain. Initial BP 60/40 improved to 125/60 after 4 L of IV fluid and O2 sats maintained on room air. Creatinine 2.5( at Pt baseline), lipase 40,000. CT showing uncomplicated pancreatitis and signs of cholelithiasis without acute cholecystitis. Requesting transfer for need of GI. Accepted patient to a MedSurg bed as inpatient if GI formally consulted prior to transport.     Hospital Course:    Brief Summary 72 y.o.malewith medical history significant ofDM, Afib  and CKD (unspecified) presented as a transfer from [facility] with gallstone pancreatitis on 09/01/2017, s/p Lap chol on 08/06/17  Plan:- 1)Gallstone Pancreatitis -- GI surgical consult appreciated, s/p Laparoscopic cholecystectomy  with IOC on 09/03/17,   the patient apparently was over 41 k at outside facility, initial lipase here was 952  , lipase trended down, leukocytosis improving white count is down to 12,000 from 14.8, suspect this may be reactive leukocytosis, no fevers, clinically much improved, advise repeat CBC with PCP next week  2)H/o PAFib--- CHA2DS2- VASc score   is = 4 (age, HTN, DM, CHF)  Which is  equal to =  4.8 % annual risk of stroke ,   continue digoxin 125 mcg daily and metoprolol for rate control , patient will discuss FULL anticoagulation with PCP post discharge   3)DM2-last A1c 6.0, diet has been advanced so okay to resume Lantus insulin  qhs may be able to use PTA dose   4)HTN--- BP was low at outside facility, BP is better now, decrease lisinopril and Lasix due to kidney concerns and soft BP,   5)h/o CHF--- previous echo not available, unable to determine systolic versus diastolic, overall patient does not appear to be in florid heart failure or volume overload status at this time, expectant management at this time,  decrease lisinopril and Lasix due to kidney concerns and soft BP,   6)AKI----acute kidney injury on CKD stage - ??? III  (old records unavailablea) ,    creatinine on admission= 2.5  ,   baseline creatinine = ????   , creatinine is now= 1.43     , renally adjust medications, avoid nephrotoxic agents/dehydration/hypotension, admission creatinine was 2.5, creatinine down to 1.43 with hydration.,  Renal function has improved,  decrease lisinopril and Lasix due to kidney concerns and soft BP, repeat BMP next week with PCP    Code Status : Full   Disposition Plan  : Home  Consults  :  Gi/Gen surg  Discharge Condition: stable  Follow UP  Follow-up Information    [facility], [place] Follow up in 2 week(s).   Specialty:  General Surgery Why:  our office willl call you with appointment date and time Contact information: [place]  [id number] [phone]           Diet and Activity recommendation:  As advised  Discharge Instructions    Discharge Instructions    (HEART FAILURE PATIENTS) Call MD:  Anytime you have any of the following symptoms: 1) 3 pound weight gain in 24 hours or 5 pounds in 1 week 2) shortness of breath, with or without a dry hacking cough 3) swelling in the hands, feet or stomach 4) if you have to sleep on extra pillows at night in order to breathe.   Complete  by:  As directed    Call MD for:  difficulty breathing, headache or visual disturbances   Complete by:  As directed    Call MD for:  persistant dizziness or light-headedness   Complete by:  As directed    Call MD for:  persistant nausea and vomiting   Complete by:  As directed    Call MD for:  redness, tenderness, or signs of infection (pain, swelling, redness, odor or green/yellow discharge around incision site)   Complete by:  As directed    Call MD for:  severe uncontrolled pain   Complete by:  As directed    Call MD for:  temperature >100.4   Complete by:  As directed    Diet - low sodium heart healthy   Complete by:  As directed    Diet Carb Modified   Complete by:  As directed    Discharge instructions   Complete by:  As directed    1)Avoid ibuprofen/Advil/Aleve/Motrin/Goody Powders/Naproxen/BC powders/Meloxicam/Diclofenac/Indomethacin and other Nonsteroidal anti-inflammatory medications as these will make you more likely to bleed and can cause stomach ulcers, can also cause Kidney problems.  2)Repeat CBC (complete Blood Count)  and CMP (complete metabolic profile) test with your primary care doctor within a week  3) activity limitation as advised by general surgeon  4)follow  up with general surgeon as advised  5)Very low-salt diet advised 6)Weigh yourself daily, call if you gain more than 3 pounds in 1 day or more than 5 pounds in 1 week as your diuretic medications may need to be adjusted 7)Limit your Fluid  intake to no  more than 60 ounces (1.8 Liters) per day   Increase activity slowly   Complete by:  As directed         Discharge Medications     Allergies as of 09/06/2017      Reactions   Penicillins Hives   Has patient had a PCN reaction causing immediate rash, facial/tongue/throat swelling, SOB or lightheadedness with hypotension: No Has patient had a PCN reaction causing severe rash involving mucus membranes or skin necrosis: No Has patient had a PCN reaction that required hospitalization: No Has patient had a PCN reaction occurring within the last 10 years: No If all of the above answers are "NO", then may proceed with Cephalosporin use.      Medication List    TAKE these medications   allopurinol 100 MG tablet Commonly known as:  ZYLOPRIM Take 200 mg by mouth daily.   cholecalciferol 1000 units tablet Commonly known as:  VITAMIN D Take 1,000 Units by mouth daily.   digoxin 0.125 MG tablet Commonly known as:  LANOXIN Take 125 mcg by mouth daily.   ferrous sulfate 325 (65 FE) MG tablet Take 325 mg by mouth daily.   furosemide 40 MG tablet Commonly known as:  LASIX Take 1 tablet (40 mg total) by mouth 2 (two) times daily. What changed:  how much to take   HUMALOG KWIKPEN 100 UNIT/ML KiwkPen Generic drug:  insulin lispro Inject 10-11 Units into the skin 2 (two) times daily with a meal.   KLOR-CON M20 20 MEQ tablet Generic drug:  potassium chloride SA Take 20 mEq by mouth 2 (two) times daily.   lisinopril 40 MG tablet Commonly known as:  PRINIVIL,ZESTRIL Take 0.5 tablets (20 mg total) by mouth daily. What changed:  how much to take   loperamide 2 MG capsule Commonly known as:  IMODIUM Take 4 mg by mouth as needed  for diarrhea or loose stools.   metoprolol succinate 25 MG 24 hr tablet Commonly known as:  TOPROL-XL Take 25 mg by mouth daily.   niacin 500 MG CR tablet Commonly known as:  NIASPAN Take 500 mg by mouth at bedtime.   omeprazole 20 MG capsule Commonly  known as:  PRILOSEC Take 20 mg by mouth daily. Before breakfast   pravastatin 40 MG tablet Commonly known as:  PRAVACHOL Take 40 mg by mouth at bedtime.   TRESIBA FLEXTOUCH 100 UNIT/ML Sopn FlexTouch Pen Generic drug:  insulin degludec Inject 34 Units into the skin at bedtime.       Major procedures and Radiology Reports - PLEASE review detailed and final reports for all details, in brief -     Dg Cholangiogram Operative  Result Date: 09/03/2017 CLINICAL DATA:  72 year old male undergoing cholecystectomy EXAM: INTRAOPERATIVE CHOLANGIOGRAM TECHNIQUE: Cholangiographic images from the C-arm fluoroscopic device were submitted for interpretation post-operatively. Please see the procedural report for the amount of contrast and the fluoroscopy time utilized. COMPARISON:  None. FINDINGS: A cine clip and spot image were obtained during intraoperative cholangiogram at the time of laparoscopic cholecystectomy. The images demonstrate cannulation of the cystic duct remanent and opacification of the biliary tree. No evidence of biliary duct dilatation, stenosis, stricture or choledocholithiasis. Contrast material refluxes into the main pancreatic duct and passes through the ampulla into the duodenum. IMPRESSION: Negative intraoperative cholangiogram. Electronically Signed   By: Jacqulynn Cadet M.D.   On: 09/03/2017 13:34    Micro Results    Recent Results (from the past 240 hour(s))  Surgical pcr screen     Status: Abnormal   Collection Time: 09/03/17  6:57 AM  Result Value Ref Range Status   MRSA, PCR NEGATIVE NEGATIVE Final   Staphylococcus aureus POSITIVE (A) NEGATIVE Final    Comment: (NOTE) The Xpert SA Assay (FDA approved for NASAL specimens in patients 53 years of age and older), is one component of a comprehensive surveillance program. It is not intended to diagnose infection nor to guide or monitor treatment. Performed at [facility], [place] [address] [place],  [place] [address]        Today   Subjective    Chadwin Fury today has no new complaints, voiding well, ambulated with staff no dizziness no dyspnea on exertion no palpitations, tolerating solid food          Patient has been seen and examined prior to discharge   Objective   Blood pressure 135/68, pulse 68, temperature 98.6 F (37 C), resp. rate 16, height 5\' 11"  (1.803 m), weight 126.4 kg, SpO2 99 %.   Intake/Output Summary (Last 24 hours) at 09/06/2017 1516 Last data filed at 09/06/2017 0900 Gross per 24 hour  Intake 1039.93 ml  Output 400 ml  Net 639.93 ml    Exam  Gen:- Awake Alert, in no apparent distress , obese HEENT:- [place].AT,   poor dentition Neck-Supple Neck,No JVD,.  Lungs-  CTAB, fair symmetrical air movement CV- S1, S2 normal , irregular Abd-  +ve B.Sounds, Abd Soft, appropriate postop tenderness  without rebound  Extremity/Skin:- No  edema,   good pulses Psych-affect is appropriate, oriented x3 Neuro-no new focal deficits, no tremors   Data Review   CBC w Diff:  Lab Results  Component Value Date   WBC 12.0 (H) 09/05/2017   HGB 11.3 (L) 09/05/2017   HCT 34.5 (L) 09/05/2017   PLT 123 (L) 09/05/2017   LYMPHOPCT 10 09/01/2017  MONOPCT 5 09/01/2017   EOSPCT 1 09/01/2017   BASOPCT 0 09/01/2017    CMP:  Lab Results  Component Value Date   NA 142 09/05/2017   K 3.8 09/05/2017   CL 108 09/05/2017   CO2 28 09/05/2017   BUN 23 09/05/2017   CREATININE 1.43 (H) 09/05/2017   PROT 6.0 (L) 09/05/2017   ALBUMIN 2.8 (L) 09/05/2017   BILITOT 0.7 09/05/2017   ALKPHOS 73 09/05/2017   AST 42 (H) 09/05/2017   ALT 61 (H) 09/05/2017   Total Discharge time is about 33 minutes  Roxan Hockey M.D on 09/06/2017 at 3:16 PM  Pager---[phone]  Go to [email or web address] - password [email or web address] for contact info  [facility] - Office  [phone]

## 2017-09-06 NOTE — Discharge Instructions (Signed)
1)Avoid ibuprofen/Advil/Aleve/Motrin/Goody Powders/Naproxen/BC powders/Meloxicam/Diclofenac/Indomethacin and other Nonsteroidal anti-inflammatory medications as these will make you more likely to bleed and can cause stomach ulcers, can also cause Kidney problems.  2)Repeat CBC (complete Blood Count)  and CMP (complete metabolic profile) test with your primary care doctor within a week  3) activity limitation as advised by general surgeon  4)follow  up with general surgeon as advised  5)Very low-salt diet advised 6)Weigh yourself daily, call if you gain more than 3 pounds in 1 day or more than 5 pounds in 1 week as your diuretic medications may need to be adjusted 7)Limit your Fluid  intake to no more than 60 ounces (1.8 Liters) per day   Please arrive at least 30 min before your appointment to complete your check in paperwork.  If you are unable to arrive 30 min prior to your appointment time we may have to cancel or reschedule you.  LAPAROSCOPIC SURGERY: POST OP INSTRUCTIONS  1. DIET: Follow a light bland diet the first 24 hours after arrival home, such as soup, liquids, crackers, etc. Be sure to include lots of fluids daily. Avoid fast food or heavy meals as your are more likely to get nauseated. Eat a low fat the next few days after surgery.  2. Take your usually prescribed home medications unless otherwise directed. 3. PAIN CONTROL:  1. Pain is best controlled by a usual combination of three different methods TOGETHER:  i. Ice/Heat ii. Over the counter pain medication iii. Prescription pain medication 2. Most patients will experience some swelling and bruising around the incisions. Ice packs or heating pads (30-60 minutes up to 6 times a day) will help. Use ice for the first few days to help decrease swelling and bruising, then switch to heat to help relax tight/sore spots and speed recovery. Some people prefer to use ice alone, heat alone, alternating between ice & heat. Experiment to what  works for you. Swelling and bruising can take several weeks to resolve.  3. It is helpful to take an over-the-counter pain medication regularly for the first few weeks. Choose one of the following that works best for you:  i. Naproxen (Aleve, etc) Two 220mg  tabs twice a day ii. Ibuprofen (Advil, etc) Three 200mg  tabs four times a day (every meal & bedtime) iii. Acetaminophen (Tylenol, etc) 500-650mg  four times a day (every meal & bedtime) 4. A prescription for pain medication (such as oxycodone, hydrocodone, etc) should be given to you upon discharge. Take your pain medication as prescribed.  i. If you are having problems/concerns with the prescription medicine (does not control pain, nausea, vomiting, rash, itching, etc), please call us [phone] to see if we need to switch you to a different pain medicine that will work better for you and/or control your side effect better. ii. If you need a refill on your pain medication, please contact your pharmacy. They will contact our office to request authorization. Prescriptions will not be filled after 5 pm or on week-ends. 1. Avoid getting constipated. Between the surgery and the pain medications, it is common to experience some constipation. Increasing fluid intake and taking a fiber supplement (such as Metamucil, Citrucel, FiberCon, MiraLax, etc) 1-2 times a day regularly will usually help prevent this problem from occurring. A mild laxative (prune juice, Milk of Magnesia, MiraLax, etc) should be taken according to package directions if there are no bowel movements after 48 hours.  2. Watch out for diarrhea. If you have many loose bowel movements, simplify your  diet to bland foods & liquids for a few days. Stop any stool softeners and decrease your fiber supplement. Switching to mild anti-diarrheal medications (Kayopectate, Pepto Bismol) can help. If this worsens or does not improve, please call us. 3. Wash / shower every day. You may shower over the  dressings as they are waterproof. Continue to shower over incision(s) after the dressing is off. 4. Remove your waterproof bandages 5 days after surgery. You may leave the incision open to air. You may replace a dressing/Band-Aid to cover the incision for comfort if you wish.  5. ACTIVITIES as tolerated:  a. You may resume regular (light) daily activities beginning the next day--such as daily self-care, walking, climbing stairs--gradually increasing activities as tolerated. If you can walk 30 minutes without difficulty, it is safe to try more intense activity such as jogging, treadmill, bicycling, low-impact aerobics, swimming, etc. b. Save the most intensive and strenuous activity for last such as sit-ups, heavy lifting, contact sports, etc Refrain from any heavy lifting or straining until you are off narcotics for pain control.  c. DO NOT PUSH THROUGH PAIN. Let pain be your guide: If it hurts to do something, don't do it. Pain is your body warning you to avoid that activity for another week until the pain goes down. d. You may drive when you are no longer taking prescription pain medication, you can comfortably wear a seatbelt, and you can safely maneuver your car and apply brakes. e. Dennis Bast may have sexual intercourse when it is comfortable.  6. FOLLOW UP in our office  a. Please call [facility] at [phone] to set up an appointment to see your surgeon in the office for a follow-up appointment approximately 2-3 weeks after your surgery. b. Make sure that you call for this appointment the day you arrive home to insure a convenient appointment time.      10. IF YOU HAVE DISABILITY OR FAMILY LEAVE FORMS, BRING THEM TO THE               OFFICE FOR PROCESSING.   WHEN TO CALL us [phone]:  1. Poor pain control 2. Reactions / problems with new medications (rash/itching, nausea, etc)  3. Fever over 101.5 F (38.5 C) 4. Inability to urinate 5. Nausea and/or vomiting 6. Worsening swelling or  bruising 7. Continued bleeding from incision. 8. Increased pain, redness, or drainage from the incision  The clinic staff is available to answer your questions during regular business hours (8:30am-5pm). Please dont hesitate to call and ask to speak to one of our nurses for clinical concerns.  If you have a medical emergency, go to the nearest emergency room or call 911.  A surgeon from [facility] is always on call at the [facility], [place], [place], [place], [place] [address] ?  MAIN: [phone] ? TOLL FREE: [phone] ?  FAX [phone]  [email or web address]   1)Avoid ibuprofen/Advil/Aleve/Motrin/Goody Powders/Naproxen/BC powders/Meloxicam/Diclofenac/Indomethacin and other Nonsteroidal anti-inflammatory medications as these will make you more likely to bleed and can cause stomach ulcers, can also cause Kidney problems.  2)Repeat CBC (complete Blood Count)  and CMP (complete metabolic profile) test with your primary care doctor within a week  3) activity limitation as advised by general surgeon  4)follow  up with general surgeon as advised  5)Very low-salt diet advised 6)Weigh yourself daily, call if you gain more than 3 pounds in 1 day or more than 5 pounds in  1 week as your diuretic medications may need to be adjusted 7)Limit your Fluid  intake to no more than 60 ounces (1.8 Liters) per day

## 2017-09-06 NOTE — Plan of Care (Signed)
  Problem: Pain Managment: Goal: General experience of comfort will improve Outcome: Progressing   Problem: Nutrition: Goal: Adequate nutrition will be maintained Outcome: Progressing   Problem: Skin Integrity: Goal: Risk for impaired skin integrity will decrease Outcome: Progressing

## 2017-09-12 DIAGNOSIS — N183 Chronic kidney disease, stage 3 (moderate): Secondary | ICD-10-CM | POA: Diagnosis not present

## 2017-09-12 DIAGNOSIS — K8020 Calculus of gallbladder without cholecystitis without obstruction: Secondary | ICD-10-CM | POA: Diagnosis not present

## 2017-10-03 DIAGNOSIS — K219 Gastro-esophageal reflux disease without esophagitis: Secondary | ICD-10-CM | POA: Diagnosis not present

## 2017-10-12 DIAGNOSIS — Z23 Encounter for immunization: Secondary | ICD-10-CM | POA: Diagnosis not present

## 2017-10-12 DIAGNOSIS — E119 Type 2 diabetes mellitus without complications: Secondary | ICD-10-CM | POA: Diagnosis not present

## 2017-10-28 DIAGNOSIS — Z794 Long term (current) use of insulin: Secondary | ICD-10-CM | POA: Diagnosis not present

## 2017-10-28 DIAGNOSIS — Z79899 Other long term (current) drug therapy: Secondary | ICD-10-CM | POA: Diagnosis not present

## 2017-10-28 DIAGNOSIS — K219 Gastro-esophageal reflux disease without esophagitis: Secondary | ICD-10-CM | POA: Diagnosis not present

## 2017-10-28 DIAGNOSIS — I4891 Unspecified atrial fibrillation: Secondary | ICD-10-CM | POA: Diagnosis not present

## 2017-10-28 DIAGNOSIS — Z86010 Personal history of colonic polyps: Secondary | ICD-10-CM | POA: Diagnosis not present

## 2017-10-28 DIAGNOSIS — I509 Heart failure, unspecified: Secondary | ICD-10-CM | POA: Diagnosis not present

## 2017-10-28 DIAGNOSIS — I110 Hypertensive heart disease with heart failure: Secondary | ICD-10-CM | POA: Diagnosis not present

## 2017-10-28 DIAGNOSIS — M1990 Unspecified osteoarthritis, unspecified site: Secondary | ICD-10-CM | POA: Diagnosis not present

## 2017-10-28 DIAGNOSIS — E119 Type 2 diabetes mellitus without complications: Secondary | ICD-10-CM | POA: Diagnosis not present

## 2017-10-28 DIAGNOSIS — E785 Hyperlipidemia, unspecified: Secondary | ICD-10-CM | POA: Diagnosis not present

## 2017-10-28 DIAGNOSIS — K648 Other hemorrhoids: Secondary | ICD-10-CM | POA: Diagnosis not present

## 2017-10-28 DIAGNOSIS — Z09 Encounter for follow-up examination after completed treatment for conditions other than malignant neoplasm: Secondary | ICD-10-CM | POA: Diagnosis not present

## 2017-10-28 DIAGNOSIS — Z1211 Encounter for screening for malignant neoplasm of colon: Secondary | ICD-10-CM | POA: Diagnosis not present

## 2018-01-02 DIAGNOSIS — E119 Type 2 diabetes mellitus without complications: Secondary | ICD-10-CM | POA: Diagnosis not present

## 2018-01-02 DIAGNOSIS — I10 Essential (primary) hypertension: Secondary | ICD-10-CM | POA: Diagnosis not present

## 2018-01-02 DIAGNOSIS — Z1389 Encounter for screening for other disorder: Secondary | ICD-10-CM | POA: Diagnosis not present

## 2018-01-02 DIAGNOSIS — E1121 Type 2 diabetes mellitus with diabetic nephropathy: Secondary | ICD-10-CM | POA: Diagnosis not present

## 2018-01-02 DIAGNOSIS — I5032 Chronic diastolic (congestive) heart failure: Secondary | ICD-10-CM | POA: Diagnosis not present

## 2018-01-02 DIAGNOSIS — Z0000 Encounter for general adult medical examination without abnormal findings: Secondary | ICD-10-CM | POA: Diagnosis not present

## 2018-01-02 DIAGNOSIS — E7800 Pure hypercholesterolemia, unspecified: Secondary | ICD-10-CM | POA: Diagnosis not present

## 2018-01-02 DIAGNOSIS — N183 Chronic kidney disease, stage 3 (moderate): Secondary | ICD-10-CM | POA: Diagnosis not present

## 2018-04-05 DIAGNOSIS — E119 Type 2 diabetes mellitus without complications: Secondary | ICD-10-CM | POA: Diagnosis not present

## 2018-04-05 DIAGNOSIS — I10 Essential (primary) hypertension: Secondary | ICD-10-CM | POA: Diagnosis not present

## 2018-04-05 DIAGNOSIS — M109 Gout, unspecified: Secondary | ICD-10-CM | POA: Diagnosis not present

## 2018-06-26 DIAGNOSIS — E119 Type 2 diabetes mellitus without complications: Secondary | ICD-10-CM | POA: Diagnosis not present

## 2018-07-10 DIAGNOSIS — E119 Type 2 diabetes mellitus without complications: Secondary | ICD-10-CM | POA: Diagnosis not present

## 2018-07-10 DIAGNOSIS — I10 Essential (primary) hypertension: Secondary | ICD-10-CM | POA: Diagnosis not present

## 2018-10-09 DIAGNOSIS — Z23 Encounter for immunization: Secondary | ICD-10-CM | POA: Diagnosis not present

## 2018-10-09 DIAGNOSIS — E119 Type 2 diabetes mellitus without complications: Secondary | ICD-10-CM | POA: Diagnosis not present

## 2018-12-14 DIAGNOSIS — K219 Gastro-esophageal reflux disease without esophagitis: Secondary | ICD-10-CM | POA: Diagnosis not present

## 2019-02-19 DIAGNOSIS — E7800 Pure hypercholesterolemia, unspecified: Secondary | ICD-10-CM | POA: Diagnosis not present

## 2019-02-19 DIAGNOSIS — D649 Anemia, unspecified: Secondary | ICD-10-CM | POA: Diagnosis not present

## 2019-02-19 DIAGNOSIS — Z1389 Encounter for screening for other disorder: Secondary | ICD-10-CM | POA: Diagnosis not present

## 2019-02-19 DIAGNOSIS — Z0000 Encounter for general adult medical examination without abnormal findings: Secondary | ICD-10-CM | POA: Diagnosis not present

## 2019-02-19 DIAGNOSIS — E119 Type 2 diabetes mellitus without complications: Secondary | ICD-10-CM | POA: Diagnosis not present

## 2019-02-19 DIAGNOSIS — I4891 Unspecified atrial fibrillation: Secondary | ICD-10-CM | POA: Diagnosis not present

## 2019-02-19 DIAGNOSIS — I10 Essential (primary) hypertension: Secondary | ICD-10-CM | POA: Diagnosis not present

## 2019-05-22 DIAGNOSIS — N1831 Chronic kidney disease, stage 3a: Secondary | ICD-10-CM | POA: Diagnosis not present

## 2019-05-22 DIAGNOSIS — E119 Type 2 diabetes mellitus without complications: Secondary | ICD-10-CM | POA: Diagnosis not present

## 2019-05-22 DIAGNOSIS — D696 Thrombocytopenia, unspecified: Secondary | ICD-10-CM | POA: Diagnosis not present

## 2019-05-22 DIAGNOSIS — E7800 Pure hypercholesterolemia, unspecified: Secondary | ICD-10-CM | POA: Diagnosis not present

## 2019-05-22 DIAGNOSIS — E1121 Type 2 diabetes mellitus with diabetic nephropathy: Secondary | ICD-10-CM | POA: Diagnosis not present

## 2019-08-23 DIAGNOSIS — E1121 Type 2 diabetes mellitus with diabetic nephropathy: Secondary | ICD-10-CM | POA: Diagnosis not present

## 2019-08-23 DIAGNOSIS — N1831 Chronic kidney disease, stage 3a: Secondary | ICD-10-CM | POA: Diagnosis not present

## 2019-09-24 DIAGNOSIS — Z23 Encounter for immunization: Secondary | ICD-10-CM | POA: Diagnosis not present

## 2019-12-03 DIAGNOSIS — N1831 Chronic kidney disease, stage 3a: Secondary | ICD-10-CM | POA: Diagnosis not present

## 2019-12-03 DIAGNOSIS — E1121 Type 2 diabetes mellitus with diabetic nephropathy: Secondary | ICD-10-CM | POA: Diagnosis not present

## 2020-01-31 DIAGNOSIS — E119 Type 2 diabetes mellitus without complications: Secondary | ICD-10-CM | POA: Diagnosis not present

## 2020-03-06 DIAGNOSIS — N1832 Chronic kidney disease, stage 3b: Secondary | ICD-10-CM | POA: Diagnosis not present

## 2020-03-06 DIAGNOSIS — I10 Essential (primary) hypertension: Secondary | ICD-10-CM | POA: Diagnosis not present

## 2020-03-06 DIAGNOSIS — E1121 Type 2 diabetes mellitus with diabetic nephropathy: Secondary | ICD-10-CM | POA: Diagnosis not present

## 2020-03-06 DIAGNOSIS — I4891 Unspecified atrial fibrillation: Secondary | ICD-10-CM | POA: Diagnosis not present

## 2020-03-06 DIAGNOSIS — E7800 Pure hypercholesterolemia, unspecified: Secondary | ICD-10-CM | POA: Diagnosis not present

## 2020-03-06 DIAGNOSIS — Z0000 Encounter for general adult medical examination without abnormal findings: Secondary | ICD-10-CM | POA: Diagnosis not present

## 2020-03-06 DIAGNOSIS — I872 Venous insufficiency (chronic) (peripheral): Secondary | ICD-10-CM | POA: Diagnosis not present

## 2020-03-06 DIAGNOSIS — I5032 Chronic diastolic (congestive) heart failure: Secondary | ICD-10-CM | POA: Diagnosis not present

## 2020-03-12 DIAGNOSIS — I4891 Unspecified atrial fibrillation: Secondary | ICD-10-CM | POA: Diagnosis not present

## 2020-03-12 DIAGNOSIS — K219 Gastro-esophageal reflux disease without esophagitis: Secondary | ICD-10-CM | POA: Diagnosis not present

## 2020-03-12 DIAGNOSIS — M109 Gout, unspecified: Secondary | ICD-10-CM | POA: Diagnosis not present

## 2020-03-12 DIAGNOSIS — E1121 Type 2 diabetes mellitus with diabetic nephropathy: Secondary | ICD-10-CM | POA: Diagnosis not present

## 2020-03-12 DIAGNOSIS — I10 Essential (primary) hypertension: Secondary | ICD-10-CM | POA: Diagnosis not present

## 2020-06-09 DIAGNOSIS — E1121 Type 2 diabetes mellitus with diabetic nephropathy: Secondary | ICD-10-CM | POA: Diagnosis not present

## 2020-06-09 DIAGNOSIS — N1832 Chronic kidney disease, stage 3b: Secondary | ICD-10-CM | POA: Diagnosis not present

## 2020-07-21 IMAGING — RF DG CHOLANGIOGRAM OPERATIVE
1 series · 5 of 5 positions shown · non-contrast
Comparison: None.

CLINICAL DATA: 71-year-old male undergoing cholecystectomy

EXAM:
INTRAOPERATIVE CHOLANGIOGRAM
TECHNIQUE: Cholangiographic images from the C-arm fluoroscopic device were
submitted for interpretation post-operatively. Please see the
procedural report for the amount of contrast and the fluoroscopy
time utilized.

[Series 1: run · 2 acquisitions, 5 frames shown]
[im 1/2]
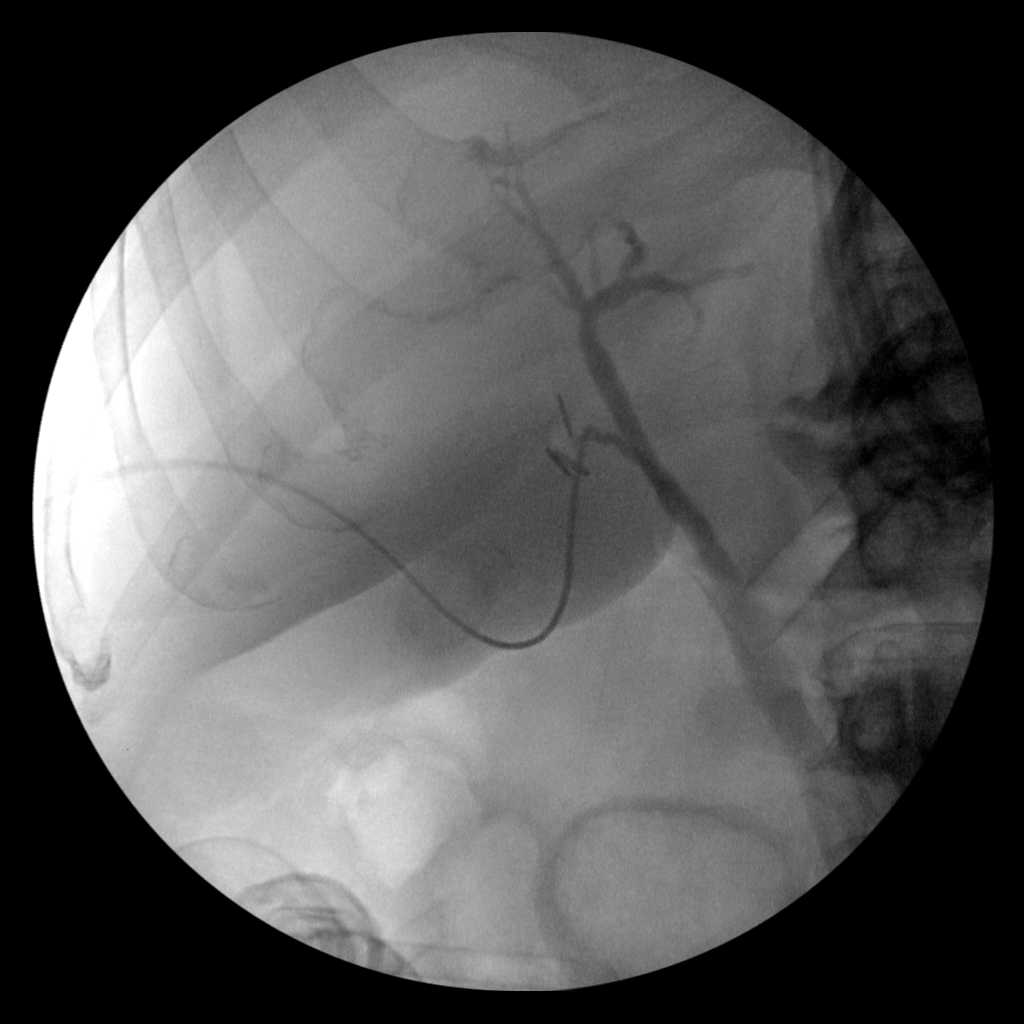
[im 1/2]
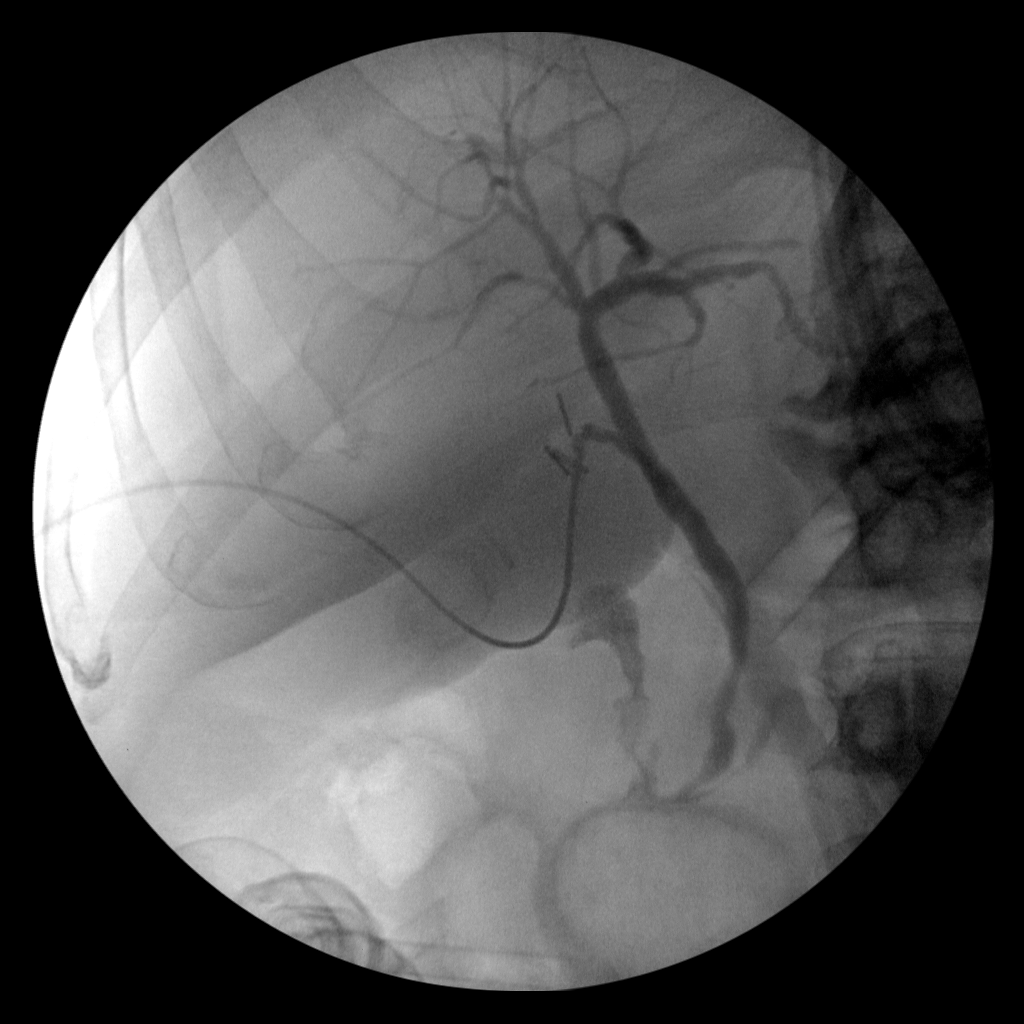
[im 1/2]
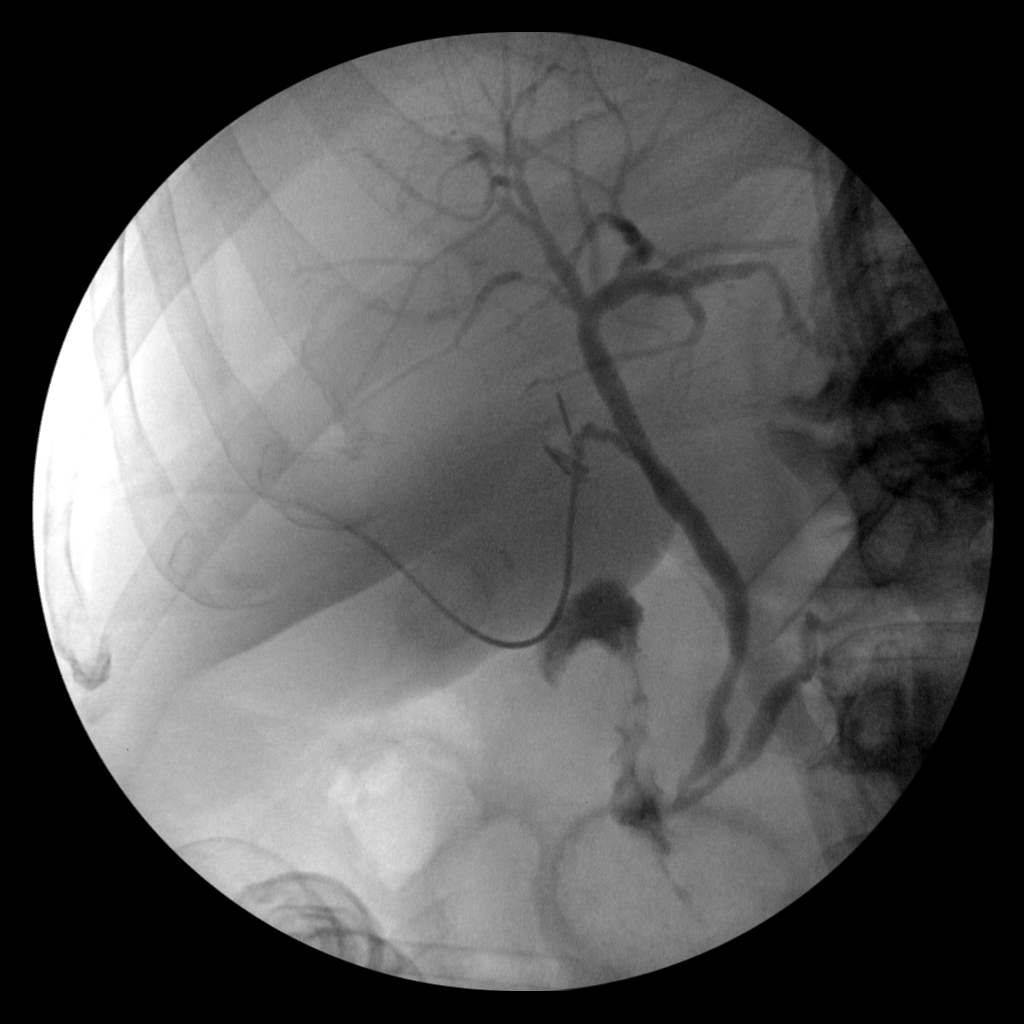
[im 1/2]
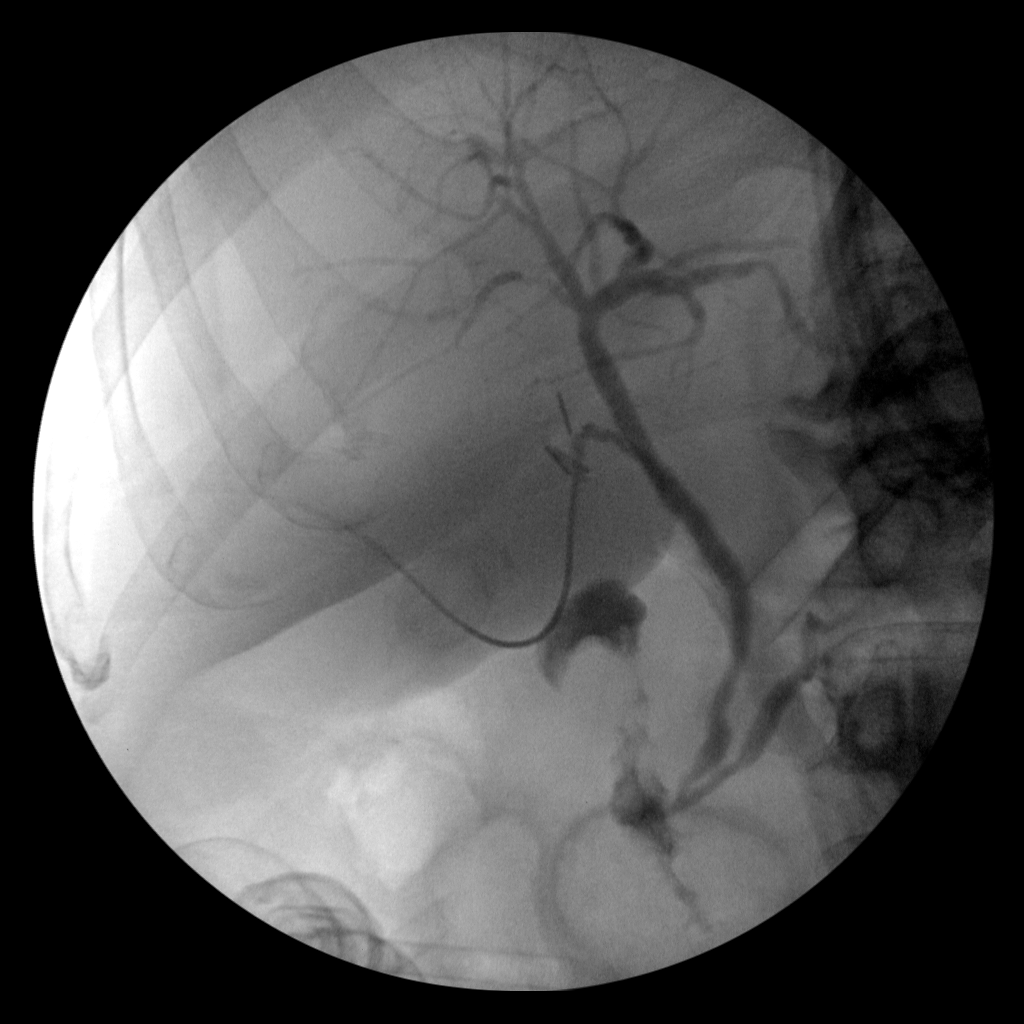
[im 2/2]
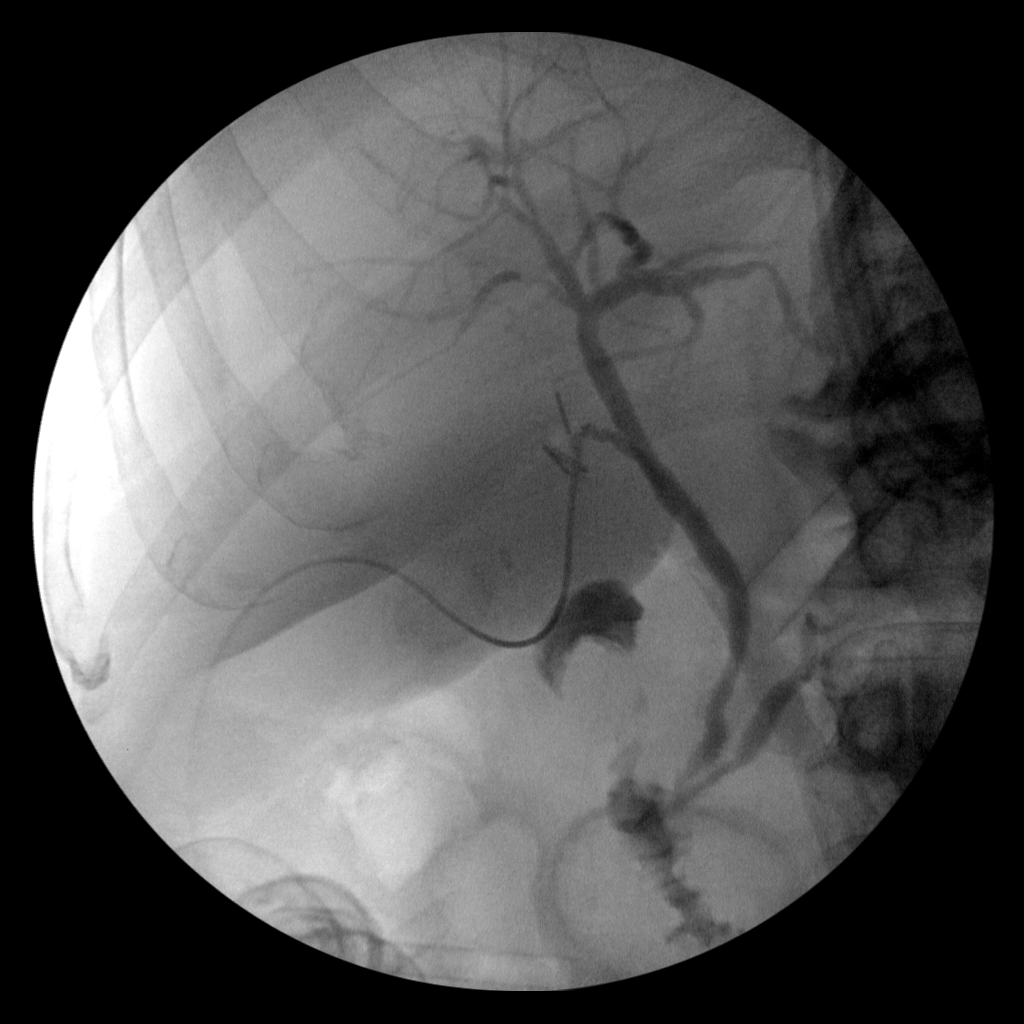

[5 of 5 positions shown; findings below may reference images not displayed]

FINDINGS: A cine clip and spot image were obtained during intraoperative
cholangiogram at the time of laparoscopic cholecystectomy. The
images demonstrate cannulation of the cystic duct remanent and
opacification of the biliary tree. No evidence of biliary duct
dilatation, stenosis, stricture or choledocholithiasis. Contrast
material refluxes into the main pancreatic duct and passes through
the ampulla into the duodenum.
IMPRESSION: Negative intraoperative cholangiogram.

## 2020-07-28 DIAGNOSIS — H905 Unspecified sensorineural hearing loss: Secondary | ICD-10-CM | POA: Diagnosis not present

## 2020-09-10 DIAGNOSIS — I10 Essential (primary) hypertension: Secondary | ICD-10-CM | POA: Diagnosis not present

## 2020-09-10 DIAGNOSIS — E1121 Type 2 diabetes mellitus with diabetic nephropathy: Secondary | ICD-10-CM | POA: Diagnosis not present

## 2020-09-10 DIAGNOSIS — N1832 Chronic kidney disease, stage 3b: Secondary | ICD-10-CM | POA: Diagnosis not present

## 2020-12-11 DIAGNOSIS — E1121 Type 2 diabetes mellitus with diabetic nephropathy: Secondary | ICD-10-CM | POA: Diagnosis not present

## 2020-12-11 DIAGNOSIS — N1832 Chronic kidney disease, stage 3b: Secondary | ICD-10-CM | POA: Diagnosis not present

## 2021-01-02 DIAGNOSIS — M7522 Bicipital tendinitis, left shoulder: Secondary | ICD-10-CM | POA: Diagnosis not present

## 2021-01-07 DIAGNOSIS — R531 Weakness: Secondary | ICD-10-CM | POA: Diagnosis not present

## 2021-01-07 DIAGNOSIS — Z880 Allergy status to penicillin: Secondary | ICD-10-CM | POA: Diagnosis not present

## 2021-01-07 DIAGNOSIS — R55 Syncope and collapse: Secondary | ICD-10-CM | POA: Diagnosis not present

## 2021-01-07 DIAGNOSIS — I6782 Cerebral ischemia: Secondary | ICD-10-CM | POA: Diagnosis not present

## 2021-01-07 DIAGNOSIS — A084 Viral intestinal infection, unspecified: Secondary | ICD-10-CM | POA: Diagnosis not present

## 2021-01-07 DIAGNOSIS — Z743 Need for continuous supervision: Secondary | ICD-10-CM | POA: Diagnosis not present

## 2021-01-07 DIAGNOSIS — Z20822 Contact with and (suspected) exposure to covid-19: Secondary | ICD-10-CM | POA: Diagnosis not present

## 2021-01-07 DIAGNOSIS — R109 Unspecified abdominal pain: Secondary | ICD-10-CM | POA: Diagnosis not present

## 2021-01-07 DIAGNOSIS — I509 Heart failure, unspecified: Secondary | ICD-10-CM | POA: Diagnosis not present

## 2021-01-07 DIAGNOSIS — I447 Left bundle-branch block, unspecified: Secondary | ICD-10-CM | POA: Diagnosis not present

## 2021-01-07 DIAGNOSIS — I4891 Unspecified atrial fibrillation: Secondary | ICD-10-CM | POA: Diagnosis not present

## 2021-01-07 DIAGNOSIS — I130 Hypertensive heart and chronic kidney disease with heart failure and stage 1 through stage 4 chronic kidney disease, or unspecified chronic kidney disease: Secondary | ICD-10-CM | POA: Diagnosis not present

## 2021-01-07 DIAGNOSIS — R42 Dizziness and giddiness: Secondary | ICD-10-CM | POA: Diagnosis not present

## 2021-01-07 DIAGNOSIS — W19XXXA Unspecified fall, initial encounter: Secondary | ICD-10-CM | POA: Diagnosis not present

## 2021-01-07 DIAGNOSIS — M109 Gout, unspecified: Secondary | ICD-10-CM | POA: Diagnosis not present

## 2021-01-07 DIAGNOSIS — N189 Chronic kidney disease, unspecified: Secondary | ICD-10-CM | POA: Diagnosis not present

## 2021-01-07 DIAGNOSIS — M159 Polyosteoarthritis, unspecified: Secondary | ICD-10-CM | POA: Diagnosis not present

## 2021-01-07 DIAGNOSIS — G319 Degenerative disease of nervous system, unspecified: Secondary | ICD-10-CM | POA: Diagnosis not present

## 2021-01-07 DIAGNOSIS — R404 Transient alteration of awareness: Secondary | ICD-10-CM | POA: Diagnosis not present

## 2021-01-07 DIAGNOSIS — Z794 Long term (current) use of insulin: Secondary | ICD-10-CM | POA: Diagnosis not present

## 2021-01-07 DIAGNOSIS — E7800 Pure hypercholesterolemia, unspecified: Secondary | ICD-10-CM | POA: Diagnosis not present

## 2021-01-07 DIAGNOSIS — E11649 Type 2 diabetes mellitus with hypoglycemia without coma: Secondary | ICD-10-CM | POA: Diagnosis not present

## 2021-01-07 DIAGNOSIS — Z79899 Other long term (current) drug therapy: Secondary | ICD-10-CM | POA: Diagnosis not present

## 2021-01-08 DIAGNOSIS — R55 Syncope and collapse: Secondary | ICD-10-CM | POA: Diagnosis not present

## 2021-01-08 DIAGNOSIS — I361 Nonrheumatic tricuspid (valve) insufficiency: Secondary | ICD-10-CM

## 2021-01-08 DIAGNOSIS — I130 Hypertensive heart and chronic kidney disease with heart failure and stage 1 through stage 4 chronic kidney disease, or unspecified chronic kidney disease: Secondary | ICD-10-CM | POA: Diagnosis not present

## 2021-01-08 DIAGNOSIS — E11649 Type 2 diabetes mellitus with hypoglycemia without coma: Secondary | ICD-10-CM | POA: Diagnosis not present

## 2021-01-21 DIAGNOSIS — M7522 Bicipital tendinitis, left shoulder: Secondary | ICD-10-CM | POA: Diagnosis not present

## 2021-01-23 DIAGNOSIS — M19012 Primary osteoarthritis, left shoulder: Secondary | ICD-10-CM | POA: Diagnosis not present

## 2021-01-30 DIAGNOSIS — E119 Type 2 diabetes mellitus without complications: Secondary | ICD-10-CM | POA: Diagnosis not present

## 2021-02-04 DIAGNOSIS — H5213 Myopia, bilateral: Secondary | ICD-10-CM | POA: Diagnosis not present

## 2021-02-04 DIAGNOSIS — H524 Presbyopia: Secondary | ICD-10-CM | POA: Diagnosis not present

## 2021-03-12 DIAGNOSIS — I5032 Chronic diastolic (congestive) heart failure: Secondary | ICD-10-CM | POA: Diagnosis not present

## 2021-03-12 DIAGNOSIS — I4891 Unspecified atrial fibrillation: Secondary | ICD-10-CM | POA: Diagnosis not present

## 2021-03-12 DIAGNOSIS — N1832 Chronic kidney disease, stage 3b: Secondary | ICD-10-CM | POA: Diagnosis not present

## 2021-03-12 DIAGNOSIS — I447 Left bundle-branch block, unspecified: Secondary | ICD-10-CM | POA: Diagnosis not present

## 2021-03-12 DIAGNOSIS — I872 Venous insufficiency (chronic) (peripheral): Secondary | ICD-10-CM | POA: Diagnosis not present

## 2021-03-12 DIAGNOSIS — D696 Thrombocytopenia, unspecified: Secondary | ICD-10-CM | POA: Diagnosis not present

## 2021-03-12 DIAGNOSIS — Z0000 Encounter for general adult medical examination without abnormal findings: Secondary | ICD-10-CM | POA: Diagnosis not present

## 2021-03-12 DIAGNOSIS — E1121 Type 2 diabetes mellitus with diabetic nephropathy: Secondary | ICD-10-CM | POA: Diagnosis not present

## 2021-03-12 DIAGNOSIS — I10 Essential (primary) hypertension: Secondary | ICD-10-CM | POA: Diagnosis not present

## 2021-03-13 DIAGNOSIS — M25521 Pain in right elbow: Secondary | ICD-10-CM | POA: Diagnosis not present

## 2021-06-15 DIAGNOSIS — E7800 Pure hypercholesterolemia, unspecified: Secondary | ICD-10-CM | POA: Diagnosis not present

## 2021-06-15 DIAGNOSIS — I10 Essential (primary) hypertension: Secondary | ICD-10-CM | POA: Diagnosis not present

## 2021-06-15 DIAGNOSIS — E1121 Type 2 diabetes mellitus with diabetic nephropathy: Secondary | ICD-10-CM | POA: Diagnosis not present

## 2021-06-15 DIAGNOSIS — N1832 Chronic kidney disease, stage 3b: Secondary | ICD-10-CM | POA: Diagnosis not present

## 2021-07-23 DIAGNOSIS — H905 Unspecified sensorineural hearing loss: Secondary | ICD-10-CM | POA: Diagnosis not present

## 2021-09-28 DIAGNOSIS — E1121 Type 2 diabetes mellitus with diabetic nephropathy: Secondary | ICD-10-CM | POA: Diagnosis not present

## 2021-09-28 DIAGNOSIS — N1832 Chronic kidney disease, stage 3b: Secondary | ICD-10-CM | POA: Diagnosis not present

## 2021-12-11 ENCOUNTER — Other Ambulatory Visit: Payer: Self-pay

## 2021-12-11 MED ORDER — FARXIGA 10 MG PO TABS: 10.0000 mg | ORAL_TABLET | Freq: Every morning | ORAL | 0 refills | Status: DC

## 2021-12-11 MED ORDER — METOPROLOL SUCCINATE ER 25 MG PO TB24: 25.0000 mg | ORAL_TABLET | Freq: Every day | ORAL | 0 refills | Status: DC

## 2021-12-11 MED ORDER — OZEMPIC (0.25 OR 0.5 MG/DOSE) 2 MG/3ML ~~LOC~~ SOPN: 0.5000 mg | PEN_INJECTOR | SUBCUTANEOUS | 3 refills | Status: DC

## 2021-12-17 DIAGNOSIS — K219 Gastro-esophageal reflux disease without esophagitis: Secondary | ICD-10-CM | POA: Diagnosis not present

## 2021-12-24 ENCOUNTER — Encounter: Payer: Self-pay | Admitting: Internal Medicine

## 2021-12-24 ENCOUNTER — Ambulatory Visit: Payer: Medicare Other | Admitting: Internal Medicine

## 2021-12-24 VITALS — BP 112/72 | HR 80 | Temp 97.2°F | Resp 16 | Ht 71.0 in | Wt 285.0 lb

## 2021-12-24 DIAGNOSIS — I10 Essential (primary) hypertension: Secondary | ICD-10-CM | POA: Diagnosis not present

## 2021-12-24 DIAGNOSIS — N1832 Chronic kidney disease, stage 3b: Secondary | ICD-10-CM | POA: Diagnosis not present

## 2021-12-24 DIAGNOSIS — E1121 Type 2 diabetes mellitus with diabetic nephropathy: Secondary | ICD-10-CM | POA: Diagnosis not present

## 2021-12-24 NOTE — Progress Notes (Signed)
Office Visit  Subjective   Patient ID: Dennis Nunez   DOB: 10/11/1945   Age: 76 y.o.   MRN: [id number]   Chief Complaint Chief Complaint  Patient presents with   Follow-up    Diabetes     History of Present Illness The patient is a 76 year old Caucasian/White male who returns for a follow-up visit for his diabetes.  On his last visit, his A1c was not completely controlled and we started him on low dose ozempic 0.'25mg'$  subcut weekly.  He states he took this for a month but stopped it as he was experiencing some mild diarrhea but no nausea or vomiting.  This past year, I started him on farxiga for diabetic nephropathy.  He has had symptoms of hypoglycemia in the past where he was admitted to [facility] in 01/2021 for presyncope.  This was due to him taking his insulin and not eating and he was also having diarrhea at that time.  He remains on tresiba 38 units daily and humalog 15 units before meals which is usually twice per day as well as farxiga '10mg'$  daily. He is not walking as much as they would like.  He specifically denies unexplained abdominal pain, nausea or vomiting, or documented hypoglycemia where he has had no more hypoglycemia since his hospitalization in 01/2021. He checks blood sugars at least once per day and they tend to range somewhere between 120 and 150's mg/dl fasting. He came in fasting today in anticipation of lab work. His last HgbA1c was done 3 months ago and was 7.5%. He has no diabetic retinopathy or neuropathy but he does have probable diabetic and hypertensive nephropathy with Stage IIIb chronic kidney disease.    The patient is a 76 year old Caucasian/White male who presents for a follow-up evaluation of hypertension.  Since his last visit, there has been no problems.  The patient has not been checking his blood pressure at home.  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  He uses a stationary bike for 30-40 min.   The  patient's current medications include: lasix 40 mg BID, lisinopril 20 mg daily, and Toprol XL 25 mg daily. The patient has been tolerating his medications well. The patient denies any headache, visual changes, dizziness, lightheadness, chest pain, shortness of breath, and weakness/numbness.  He reports there have been no other symptoms noted.         Past Medical History Past Medical History:  Diagnosis Date   Atrial fibrillation ([place])    takes Dilt but not apparently AC   BPH (benign prostatic hyperplasia)    Cardiomyopathy (HCC)    Chronic diastolic (congestive) heart failure (HCC)    Chronic venous insufficiency    CKD (chronic kidney disease), stage III (HCC)    Diabetic nephropathy associated with type 2 diabetes mellitus (HCC)    Dyslipidemia    GERD (gastroesophageal reflux disease)    Gout    History of gastric ulcer    Hyperlipidemia    Left bundle branch block (LBBB)    Paroxysmal atrial tachycardia    Thrombocytopenia (HCC)      Allergies Allergies  Allergen Reactions   Aspirin    Penicillins Hives    Has patient had a PCN reaction causing immediate rash, facial/tongue/throat swelling, SOB or lightheadedness with hypotension: No Has patient had a PCN reaction causing severe rash involving mucus membranes or skin necrosis: No Has patient had a PCN  reaction that required hospitalization: No Has patient had a PCN reaction occurring within the last 10 years: No If all of the above answers are "NO", then may proceed with Cephalosporin use.     Review of Systems Review of Systems  Constitutional:  Negative for chills and fever.  Eyes:  Negative for blurred vision and double vision.  Respiratory:  Negative for cough and shortness of breath.   Cardiovascular:  Negative for chest pain, palpitations and leg swelling.  Gastrointestinal:  Negative for constipation, diarrhea, nausea and vomiting.  Musculoskeletal:  Negative for myalgias.  Neurological:  Negative for  dizziness, weakness and headaches.       Objective:    Vitals BP 112/72   Pulse 80   Temp (!) 97.2 F (36.2 C)   Resp 16   Ht '5\' 11"'$  (1.803 m)   Wt 285 lb (129.3 kg)   SpO2 99%   BMI 39.75 kg/m    Physical Examination Physical Exam Constitutional:      Appearance: Normal appearance. He is not ill-appearing.  Cardiovascular:     Rate and Rhythm: Normal rate and regular rhythm.     Pulses: Normal pulses.     Heart sounds: No murmur heard.    No friction rub. No gallop.  Pulmonary:     Effort: Pulmonary effort is normal. No respiratory distress.     Breath sounds: Normal breath sounds. No wheezing, rhonchi or rales.  Abdominal:     General: Abdomen is flat. Bowel sounds are normal. There is no distension.     Palpations: Abdomen is soft.     Tenderness: There is no abdominal tenderness.  Musculoskeletal:     Right lower leg: No edema.     Left lower leg: No edema.  Skin:    General: Skin is warm and dry.     Findings: No rash.  Neurological:     Mental Status: He is alert.        Assessment & Plan:   Essential hypertension His BP is doing well.  We will continue his current medications.  Diabetic nephropathy associated with type 2 diabetes mellitus ([place]) He has stopped his ozempic.  We will check a HgBA1c on him today.    Return in about 3 months (around 03/25/2022) for annual.   Townsend Roger, MD

## 2021-12-24 NOTE — Assessment & Plan Note (Signed)
He has stopped his ozempic.  We will check a HgBA1c on him today.

## 2021-12-24 NOTE — Assessment & Plan Note (Signed)
His BP is doing well. We will continue his current medications. 

## 2021-12-25 LAB — HEMOGLOBIN A1C
Est. average glucose Bld gHb Est-mCnc: 160 mg/dL
Hgb A1c MFr Bld: 7.2 % — ABNORMAL HIGH (ref 4.8–5.6)

## 2022-01-11 ENCOUNTER — Other Ambulatory Visit: Payer: Self-pay

## 2022-01-11 MED ORDER — BD PEN NEEDLE SHORT U/F 31G X 8 MM MISC: 5 refills | Status: DC

## 2022-02-01 NOTE — Telephone Encounter (Signed)
-----  Message from Townsend Roger, MD sent at 01/31/2022  9:11 PM EST ----- A1c is not controlled.  Increase tresiba to 40 units daily.

## 2022-02-01 NOTE — Telephone Encounter (Signed)
This encounter was created in error - please disregard.

## 2022-02-02 ENCOUNTER — Other Ambulatory Visit: Payer: Self-pay

## 2022-02-02 NOTE — Progress Notes (Signed)
Patient called.  Patient aware.  A1c is not controlled. Increase tresiba to 40 units daily.

## 2022-02-03 ENCOUNTER — Other Ambulatory Visit: Payer: Self-pay

## 2022-02-03 MED ORDER — METOPROLOL SUCCINATE ER 25 MG PO TB24: 25.0000 mg | ORAL_TABLET | Freq: Every day | ORAL | 0 refills | Status: DC

## 2022-02-03 MED ORDER — DIGOXIN 125 MCG PO TABS: 125.0000 ug | ORAL_TABLET | Freq: Every day | ORAL | 1 refills | Status: DC

## 2022-02-03 MED ORDER — FUROSEMIDE 40 MG PO TABS: 40.0000 mg | ORAL_TABLET | Freq: Two times a day (BID) | ORAL | 3 refills | Status: DC

## 2022-02-08 DIAGNOSIS — H905 Unspecified sensorineural hearing loss: Secondary | ICD-10-CM | POA: Diagnosis not present

## 2022-02-26 ENCOUNTER — Other Ambulatory Visit: Payer: Self-pay

## 2022-02-26 ENCOUNTER — Ambulatory Visit: Payer: 59

## 2022-02-26 DIAGNOSIS — E785 Hyperlipidemia, unspecified: Secondary | ICD-10-CM

## 2022-02-26 DIAGNOSIS — E1121 Type 2 diabetes mellitus with diabetic nephropathy: Secondary | ICD-10-CM

## 2022-02-26 DIAGNOSIS — I10 Essential (primary) hypertension: Secondary | ICD-10-CM

## 2022-02-26 MED ORDER — FREESTYLE LIBRE 3 SENSOR MISC: 3 refills | Status: DC

## 2022-02-26 MED ORDER — FREESTYLE LIBRE 2 READER DEVI: 0 refills | Status: DC

## 2022-02-26 NOTE — Progress Notes (Addendum)
Initial Pharmacist Visit (CMCS)  [id number] years, Male  DOB: 07-11-1945  M: [id number]   Clinical Summary Next Pharmacist Follow Up: 03/05/22 Libre set up  Next AWV: 03/25/22 Summary for PCP:  - Recommend CGM device (Freestyle libre), UACR and eye exam for DM - Recommended increasing to high intensity statin (ASCVD>20%) - Recommend Updated vaccinations Physician Referral Statement:: I reaffirm my previous referral of patient for CMCS services . Patient's Chronic Conditions: Hypertension (HTN), Hyperlipidemia/Dyslipidemia (HLD), Diabetes (DM), Heart Failure, Atrial Fibrillation, Gout  Disease Assessments Visit Date Visit Completed on: 02/26/2022  Subjective Information Did patient bring medications to appointment?: Yes Subjective: Patient is a pleasant elderly gentleman who presented for Initial CMCS visit. He was never married and has no children. He does woodwork at home and works around the yard. Other times he watches TV. 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 He has a great interest in history, politics and trivia. When younger he took care of his mother who was a diabetic, until she passed away. His father was a Architect person and pt helped him till he died. 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 His father remarried and when he died, the patient said his stepmom and stepsister took everything. He calls himself a step above homeless person. He has 2 close friends and is very close to church members. He has no living siblings, has a couple of cousins. Lifestyle habits: Diet: He is on limited income and cooks most of the meals. He likes to eat out 2-3 times a month. BF may be bologna or sausage sandwich, eggs, grits, bacon, cereal, cheese sandwich. Lunch may steak, salad, potatoes. Dinners are frozen dinners and add can of beans or asparagus, chicken and rice with onions, mushrooms. Snacks are cookies, Oreos, raisin cakes. He drinks water, loves coke  regular, tea, orange juice. He doesn't drink coffee as it gives him acid reflux  exercise: none Tobacco: none Alcohol: occasional What is the patient's sleep pattern?: Other (with free form text), Trouble falling asleep Details: sleeps around 2AM and up at [place]. May take naps during the day How many hours per night does patient typically sleep?: 4-5hrs  SDOH: Accountable Health Communities Health-Related Social Needs Screening Tool ([email or web address]) SDOH questions completed during initial visit?: Yes What is your living situation today? (ref #1): I have a steady place to live Think about the place you live. Do you have problems with any of the following? (ref #2): None of the above Within the past 12 months, you worried that your food would run out before you got money to buy more (ref #3): Never true Within the past 12 months, the food you bought just didn't last and you didn't have money to get more (ref #4): Never true In the past 12 months, has lack of reliable transportation kept you from medical appointments, meetings, work or from getting things needed for daily living? (ref #5): No In the past 12 months, has the electric, gas, oil, or water company threatened to shut off services in your home? (ref #6): Yes How often does anyone, including family and friends, physically hurt you? (ref #7): Never (1) How often does anyone, including family and friends, insult or talk down to you? (ref #8): Never (1) How often does anyone, including friends and family, threaten you with harm? (ref #9): Never (1) How often does anyone, including family and friends, scream or curse at you? (ref #10): Never (1) Plan / Follow-up:  Oil bills are the hardest to pauy  Medication Adherence Does the [facility] have access to medication refill history?: No [facility]  Current Rx insurance plan: UHC  Hypertension (HTN) Most Recent  BP: 112/72 Most Recent HR: 80 taken on: 12/24/2021 Care Gap: Need BP documented or last BP 140/90 or higher: Addressed Assessed today?: Yes BP today is: 118/68 Goal: <130/80 mmHG Is Patient checking BP at home?: Yes Has patient experienced hypotension, dizziness, falls or bradycardia?: No We discussed: DASH diet:  following a diet emphasizing fruits and vegetables and low-fat dairy products along with whole grains, fish, poultry, and nuts. Reducing red meats and sugars., Proper Home BP Measurement Assessment:: Controlled Drug: Lisinopril '40mg'$  QD. Pharmacist Assessment: Appropriate, Effective, Safe, Accessible Drug: Metoprolol Succ. '25mg'$  QD. Pharmacist Assessment: Appropriate, Effective, Safe, Accessible  Hyperlipidemia/Dyslipidemia (HLD) Last Lipid panel on: 06/15/2021 TC (Goal<200): 162 LDL: 83 HDL (Goal>40): 29 TG (Goal<150): 302 ASCVD 10-year risk?is:: High (>20%) ASCVD Risk Score: 26.2% Assessed today?: No Drug: Pravastatin '40mg'$  QD. Pharmacist Assessment: Appropriate, Query Effectiveness  Diabetes (DM) Most recent A1C: 7.2 taken on: 12/24/2021 Most Recent GFR: 45 taken on: 03/13/2021 Type: 2 Care Gap: Statin therapy needed: Addressed Care Gap: Need A1c documented or last A1c > 9 %: Addressed Care Gap: Need eye exam documented in EMR or by claim: Needs to be addressed Care Gap: Need eGFR and uACR for kidney health evaluation: Needs to be addressed Assessed today?: Yes Goal A1C: < 7.0 % Type: 2 Is Patient taking statin medication?: Yes Is patient taking ACEi / ARB?: Yes Blood glucose monitoring: Not monitoring Has patient experienced any hypoglycemic episodes?: He is not sure We discussed: How to recognize and treat signs of hypoglycemia., Proper diabetic foot care including daily foot exams and wearing closed toe shoes., Avoiding both sugar-sweetened and artificially (aspartame) sweetened beverages and increase water intake. Assessment:: Uncontrolled Drug: Farxiga  '10mg'$  QD. Pharmacist Assessment: Appropriate, Query Effectiveness Drug: Humalog Kwikpen 100 UT/mL 16U BID. Pharmacist Assessment: Appropriate, Query Effectiveness Drug: Tresiba 100 U/mL 40U at bedtime. Pharmacist Assessment: Appropriate, Query Effectiveness Plan/Follow up: Start: Freestlyle Libre CGM device Order and Review: Supplies Counsel : In 1 week, provide education on using CGM device. Heart Failure Assessed today?: No  Gout Most recent uric acid: 4.9 taken on: 03/13/2021 Assessed today?: Yes We discussed: Limiting / avoiding drinks high in sugar like fruit juices and sodas Assessment:: Controlled Drug: allopurinol '100mg'$  2 tablets daily Pharmacist Assessment: Appropriate, Effective, Safe, Accessible  Atrial Fibrillation Most recent INR: No recent labs. Previous INR: No recent labs. Assessed today?: No Drug: Digoxin 0.'125mg'$  QD.  Preventative Health Care Gap: Colorectal cancer screening: Patient excluded from population (Age > 46, hx of colorectal cancer or colectomy, hospice services) Care Gap: Breast cancer screening: Patient excluded from population (Age > 20, hx of bilateral mastectomy, frailty, hospice services) Care Gap: Annual Wellness Visit (AWV): Needs to be addressed Pharmacist Interventions Intervention Details Pharmacist Interventions discussed: Yes Monitoring: Routine monitoring Education: Lifestyle modifications . Tearah Saulsbury,PharmD  Chart review 56mns 57secs  Office visit 1hr 248ms 52 secs Documentation 1hr 35m80m 22secs

## 2022-03-04 ENCOUNTER — Other Ambulatory Visit: Payer: Self-pay

## 2022-03-05 ENCOUNTER — Telehealth: Payer: Self-pay

## 2022-03-05 ENCOUNTER — Other Ambulatory Visit: Payer: Self-pay

## 2022-03-05 MED ORDER — FREESTYLE LIBRE 3 SENSOR MISC: 3 refills | Status: AC

## 2022-03-05 NOTE — Progress Notes (Signed)
CMCS Notes - Libre Updates and Financial Concerns.  03/04/22, 1:03 PM. Pt was scheduled to meet Mollie Germany, PharmD in the office on 03/05/22. The purpose of the OV was to provide [employer] training and diabetes education. Dr. [name] had sent a Rx for the [facility] sensors to pt's pharmacy on 02/26/22.  Called pt to remind him of the appt. I asked if pt had picked up his sensors from the pharmacy. He stated, "I haven't heard anything about it. [facility] [address] Deborha Payment text me when I have a prescription to pick up. I told Dr. [name] that I wondered if my phone can pick up [QR codes]. We sort of dropped it. I would love to get off of [using] lancets, because it's painful. I've been doing it for several years, and it'd be nice to get on something [else]. I know they [sent the orders] on Friday; they told me they turned it in to the pharmacy. It might be that the insurance doesn't want to pay for it. I need the insurance to pay for it; I have a low income."  Sushama called pt's pharmacy during our call. Staff told [place] requires a prior authorization. I made pt aware of this. Pt would like to proceed with the PA process. I told pt that we will need to reach out to Dr. [name] about the PA, and that the insurance usually requires at least 3 business days to process the PA once they've received it. He expressed understanding. Pt told me that he is taking his insulin as directed. At one point, he became concerned about his insurance coverage for syringes/needles. His insurance and pharmacy were able to resolve the issue. He typically checks his glucose once a day, and he checked it TID in the past.  I told pt that we or the office staff will reach out to pt when we are notified of his PA approval/denial. I asked pt to call us if the pharmacy notifies him of the approval before we call him. Pt expressed understanding. Per pt, we may leave detailed voicemail messages that contain  medical information.    Other Concerns: Pt told me that he relies on Food Stamps. He will renew his application for them in April. He will be required to submit documentation of his house payments, bank statements, and insurances. He stated that he should be notified of whether or not he can continue receiving Food Stamps by the end of April. He planned on going out tomorrow to pay bills; he will receive his social security payment at that time. Will plan on discussing pt's Food Stamp application at the end of April or the beginning of May. I encouraged him to call us PRN. Total time: 58 minutes (non-billable time). Frazier Butt, LPN.

## 2022-03-09 ENCOUNTER — Encounter: Payer: Self-pay | Admitting: *Deleted

## 2022-03-09 ENCOUNTER — Ambulatory Visit: Payer: Self-pay | Admitting: *Deleted

## 2022-03-09 DIAGNOSIS — I5032 Chronic diastolic (congestive) heart failure: Secondary | ICD-10-CM

## 2022-03-09 NOTE — Patient Outreach (Incomplete)
  Care Coordination   Initial Visit Note   03/09/2022 Name: Dennis Nunez MRN: [id number] DOB: [birth year]  Dennis Nunez is a 77 y.o. year old male who sees Townsend Roger, MD for primary care. I spoke with  [name] by phone today.  What matters to the patients health and wellness today?  Pt agrees to participate in Care Coordination program.    Goals Addressed             This Visit's Progress    Reduce financial strains       Activities and task to complete in order to accomplish goals.   I have placed a referral to the community resource care guide they will call you with resources  I have place a referral with [place] team to discuss consent needed for financial/bill assistance  resource options, they will contact you.         SDOH assessments and interventions completed:  Yes{THN Tip this will not be part of the note when signed-REQUIRED REPORT FIELD DO NOT DELETE (Optional):27901}  SDOH Interventions Today    Flowsheet Row Most Recent Value  SDOH Interventions   Food Insecurity Interventions Intervention Not Indicated  [Food Stamps $107/$326-UHC]  Housing Interventions Intervention Not Indicated  Transportation Interventions Intervention Not Indicated  Utilities Interventions Other (Comment)  Alcohol Usage Interventions Intervention Not Indicated (Score <7)  Financial Strain Interventions [id number] Referral, Other (Comment)  [CARE GUIDE REFERRAL]  Social Connections Interventions Intervention Not Indicated        Care Coordination Interventions:  Yes, provided {THN Tip this will not be part of the note when signed-REQUIRED REPORT FIELD DO NOT DELETE (Optional):27901} Interventions Today    Flowsheet Row Most Recent Value  Chronic Disease   Chronic disease during today's visit Diabetes, Congestive Heart Failure (CHF), Atrial Fibrillation (AFib)  General Interventions   General Interventions Discussed/Reviewed General Interventions Discussed, General  Interventions Reviewed, [employer], Doctor Visits, Referral to Nurse  Doctor Visits Discussed/Reviewed Annual Wellness Visits, Doctor Visits Discussed  [place] Discussed, Coping Strategies  Nutrition Interventions   Nutrition Discussed/Reviewed Nutrition Discussed  [receives Food Stamps and [facility] card]  Pharmacy Interventions   Pharmacy Dicussed/Reviewed Pharmacy Topics Discussed, Affording Medications  [Pt has Medicaid and [facility] Mcare]  Safety Interventions   Safety Discussed/Reviewed Safety Discussed, Home Safety, Safety Reviewed  [Pt has life alert button]  Home Safety Refer for community resources       Follow up plan: Referral made to *** Follow up call scheduled for ***   Encounter Outcome:  {ENCOUTCOME:27770} {THN Tip this will not be part of the note when signed-REQUIRED REPORT FIELD DO NOT DELETE (Optional):27901}

## 2022-03-10 NOTE — Patient Instructions (Signed)
Visit Information  Thank you for taking time to visit with me today. Please don't hesitate to contact me if I can be of assistance to you.   Following are the goals we discussed today:   Goals Addressed             This Visit's Progress    Reduce financial strains       Activities and task to complete in order to accomplish goals.   I have placed a referral to the community resource care guide they will call you with financial resources- to help with cost of oil bill, etc  I have place a referral with [place] team to discuss consent needed for financial/bill assistance  resource options, they will contact you.  Continue to use Food Stamps and UHC card to aide in budgeting/bills etc Continue with your church activities and with local friends         Our next appointment is by telephone on 03/22/22  Please call the care guide team at [phone] if you need to cancel or reschedule your appointment.   If you are experiencing a Mental Health or [place] or need someone to talk to, please call the Suicide and Crisis Lifeline: 988 call 911   The patient verbalized understanding of instructions, educational materials, and care plan provided today and DECLINED offer to receive copy of patient instructions, educational materials, and care plan.   Telephone follow up appointment with care management team member scheduled for: 03/22/22 Eduard Clos, MSW, [place] Worker [facility] [phone]

## 2022-03-10 NOTE — Patient Outreach (Addendum)
  Care Coordination   Initial Visit Note   03/10/2022 Name: Dennis Nunez MRN: [id number] DOB: 09-May-1945  Dennis Nunez is a 77 y.o. year old male who sees Townsend Roger, MD for primary care. I spoke with  [name] by phone today.  What matters to the patients health and wellness today?  Need help with my oil/heat bill.    Goals Addressed             This Visit's Progress    Reduce financial strains       Activities and task to complete in order to accomplish goals.   I have placed a referral to the community resource care guide they will call you with financial resources- to help with cost of oil bill, etc  I have place a referral with [place] team to discuss consent needed for financial/bill assistance  resource options, they will contact you.  Continue to use Food Stamps and UHC card to aide in budgeting/bills etc Continue with your church activities and with local friends         SDOH assessments and interventions completed:  Yes  SDOH Interventions Today    Flowsheet Row Most Recent Value  SDOH Interventions   Food Insecurity Interventions Intervention Not Indicated  [Food Stamps $107/$326-UHC]  Housing Interventions Intervention Not Indicated  Transportation Interventions Intervention Not Indicated  Utilities Interventions Other (Comment)  Alcohol Usage Interventions Intervention Not Indicated (Score <7)  Financial Strain Interventions [id number] Referral, Other (Comment)  [CARE GUIDE REFERRAL]  Social Connections Interventions Intervention Not Indicated        Care Coordination Interventions:  Yes, provided  Interventions Today    Flowsheet Row Most Recent Value  Chronic Disease   Chronic disease during today's visit Atrial Fibrillation (AFib)  General Interventions   General Interventions Discussed/Reviewed General Interventions Discussed, General Interventions Reviewed, Referral to Nurse, Community Resources  Doctor Visits Discussed/Reviewed Doctor  Visits Discussed, Annual Wellness Visits  [place] Discussed  Nutrition Interventions   Nutrition Discussed/Reviewed Nutrition Discussed  [receives Food Stamps and [facility] card]  Pharmacy Interventions   Pharmacy Dicussed/Reviewed Pharmacy Topics Discussed, Affording Medications  [Pt has Medicaid and [facility] Mcare]  Safety Interventions   Safety Discussed/Reviewed Safety Discussed  [has Life Alert button]  Home Safety Refer for community resources        Follow up plan: Referral made to [facility] and Care Guide Follow up call scheduled for 03/22/22    Encounter Outcome:  Pt. Visit Completed

## 2022-03-11 ENCOUNTER — Telehealth: Payer: Self-pay

## 2022-03-11 NOTE — Telephone Encounter (Signed)
   Telephone encounter was:  Unsuccessful.  03/11/2022 Name: Jimmie Tito MRN: [id number] DOB: 01-23-1945  Unsuccessful outbound call made today to assist with:  Financial Difficulties related to utilities.  Outreach Attempt:  1st Attempt  A HIPAA compliant voice message was left requesting a return call.  Instructed patient to call back at [phone].  [place] Resource Care Guide   ??[email or web address]  ?? [id number]   Website: [email or web address]  [email or web address]

## 2022-03-12 ENCOUNTER — Other Ambulatory Visit: Payer: Self-pay

## 2022-03-12 MED ORDER — FREESTYLE LIBRE 3 READER DEVI: 1.0000 | 0 refills | Status: DC

## 2022-03-15 ENCOUNTER — Telehealth: Payer: Self-pay

## 2022-03-15 NOTE — Telephone Encounter (Signed)
   Telephone encounter was:  Unsuccessful.  03/15/2022 Name: Dennis Nunez MRN: [id number] DOB: 05/27/1945  Unsuccessful outbound call made today to assist with:  Financial Difficulties related to utilities.  Outreach Attempt:  2nd Attempt  A HIPAA compliant voice message was left requesting a return call.  Instructed patient to call back at [phone].  [place] Resource Care Guide   [email or web address]  ?? [phone]   Website: [email or web address]  [email or web address]

## 2022-03-16 ENCOUNTER — Telehealth: Payer: Self-pay

## 2022-03-16 NOTE — Telephone Encounter (Signed)
   Telephone encounter was:  Unsuccessful.  03/16/2022 Name: Devinn Hurwitz MRN: [id number] DOB: 1945-03-08  Unsuccessful outbound call made today to assist with:  Financial Difficulties related to utilities.  Outreach Attempt:  3rd Attempt.  Referral closed unable to contact patient.  A HIPAA compliant voice message was left requesting a return call.  Instructed patient to call back at [phone]. Left message on voicemail for patient to return my call regarding utility assistance. Also left the numbers for  [employer] Program and [place] [facility] Program. Mailing information to home address.  [place] Resource Care Guide   [email or web address]  ?? [phone]   Website: [email or web address]  [email or web address]

## 2022-03-22 ENCOUNTER — Other Ambulatory Visit: Payer: Self-pay | Admitting: Internal Medicine

## 2022-03-22 ENCOUNTER — Ambulatory Visit: Payer: Self-pay | Admitting: *Deleted

## 2022-03-22 NOTE — Patient Outreach (Signed)
  Care Coordination   Follow Up Visit Note   03/22/2022 Name: Dennis Nunez MRN: [id number] DOB: 02-Aug-1945  Dennis Nunez is a 77 y.o. year old male who sees [facility] Roger, MD for primary care. I spoke with  Dennis Nunez by phone today.  What matters to the patients health and wellness today?  Need help getting my diabetes machine- and help with heat bill.    Goals Addressed             This Visit's Progress    Reduce financial strains       Activities and task to complete in order to accomplish goals.   I have asked the community resource care guide to call you again to provide resources to assist you with will call you bills/financial resources- to help with cost of oil bill, etc  Continue to use Food Stamps and [employer] card to aide in budgeting/bills etc Continue with your church activities and with local friends I have asked our RNCM to call you to assist with your medical concerns; including the diabetic blood sugar monitor machine        SDOH assessments and interventions completed:  Yes     Care Coordination Interventions:  Yes, provided   Follow up plan: Follow up call scheduled for 04/14/22    Encounter Outcome:  Pt. Visit Completed

## 2022-03-23 ENCOUNTER — Telehealth: Payer: Self-pay

## 2022-03-23 NOTE — Telephone Encounter (Signed)
   Telephone encounter was:  Successful.  03/23/2022 Name: Dennis Nunez MRN: [id number] DOB: 1945/09/17  [name] is a 77 y.o. year old male who is a primary care patient of Town[facility] Roger, MD . The community resource team was consulted for assistance with Financial Difficulties related to utilities and property tax relief.  Care guide performed the following interventions: Spoke with patient he has received information and will contact [place] [facility] and [employer]. Also, sent application for property tax relief.  Follow Up Plan:  No further follow up planned at this time. The patient has been provided with needed resources.  [place] Resource Care Guide   [email or web address]  ?? [phone]   Website: [email or web address]  [email or web address]

## 2022-03-24 ENCOUNTER — Ambulatory Visit: Payer: Self-pay

## 2022-03-24 NOTE — Patient Outreach (Signed)
  Care Coordination   Initial Visit Note   03/24/2022 Name: Dennis Nunez MRN: [id number] DOB: April 07, 1945  Dennis Nunez is a 77 y.o. year old male who sees Town[facility] Roger, MD for primary care. I spoke with  [name] by phone today.  What matters to the patients health and wellness today?  Referral from social worker regarding patients difficulty getting his Calypso 3 reader. Patient reports that he got the sensors free from insurance but was told the reader would be 100.00.  Patient reports recent A1c of 7.2  States that he takes his insulin as prescribed.  Reports that he has a regular CBG meter and his CBG today was 150.   Reports that his A fib, CHF and HTN are under good control.  Denies any other needs except his reader at this time.  Social worker and care guides are assisting with resources for heat bill.    Goals Addressed             This Visit's Progress    I need to get a blood glucose reader       Interventions Today    Flowsheet Row Most Recent Value  Chronic Disease   Chronic disease during today's visit Diabetes  General Interventions   General Interventions Discussed/Reviewed General Interventions Discussed, Durable Medical Equipment (DME), Communication with  [Placed call to MD office to request additional refills be sent to pharmacy. Spoke with Callie.]  Doctor Visits Discussed/Reviewed Doctor Visits Discussed, Annual Wellness Visits  [reviewed with patient that he has an AWV scheduled for tomorrow with PCP. Encouraged patient to take his DM equipment with his to assist with questions.]  Durable Medical Equipment (DME) [facility]  Education Interventions   Education Provided Provided Education  Provided Verbal Education On Medication, Blood Sugar Monitoring  [Placed call to [facility] and spoke with pharmacist. The [place] 3 reader must be run through part D. This was processed at the pharmacy during call at a zero dollar copay. The reader is only good for 2  weeks. No refills on reader.]  Pharmacy Interventions   Pharmacy Dicussed/Reviewed Affording Medications      Placed call to patient to provide details about his reader and that [facility] would contact him when reader was ready for pick up.  Follow up appointment scheduled with patient for 1 week to ensure everything is completed.        SDOH assessments and interventions completed:  No     Care Coordination Interventions:  Yes, provided   Follow up plan: Follow up call scheduled for 03/31/2022    Encounter Outcome:  Pt. Visit Completed   Tomasa Rand, RN, BSN, CEN [facility] Coordinator [phone]

## 2022-03-25 ENCOUNTER — Encounter: Payer: Self-pay | Admitting: Internal Medicine

## 2022-03-25 ENCOUNTER — Ambulatory Visit: Payer: 59 | Admitting: Internal Medicine

## 2022-03-25 ENCOUNTER — Other Ambulatory Visit: Payer: Self-pay

## 2022-03-25 VITALS — BP 128/80 | HR 63 | Temp 97.3°F | Resp 16 | Ht 71.0 in | Wt 284.6 lb

## 2022-03-25 DIAGNOSIS — E1121 Type 2 diabetes mellitus with diabetic nephropathy: Secondary | ICD-10-CM

## 2022-03-25 DIAGNOSIS — I447 Left bundle-branch block, unspecified: Secondary | ICD-10-CM

## 2022-03-25 DIAGNOSIS — I10 Essential (primary) hypertension: Secondary | ICD-10-CM | POA: Diagnosis not present

## 2022-03-25 DIAGNOSIS — Z0000 Encounter for general adult medical examination without abnormal findings: Secondary | ICD-10-CM | POA: Diagnosis not present

## 2022-03-25 DIAGNOSIS — E7800 Pure hypercholesterolemia, unspecified: Secondary | ICD-10-CM | POA: Diagnosis not present

## 2022-03-25 DIAGNOSIS — I872 Venous insufficiency (chronic) (peripheral): Secondary | ICD-10-CM | POA: Diagnosis not present

## 2022-03-25 DIAGNOSIS — I5032 Chronic diastolic (congestive) heart failure: Secondary | ICD-10-CM | POA: Diagnosis not present

## 2022-03-25 DIAGNOSIS — M109 Gout, unspecified: Secondary | ICD-10-CM | POA: Insufficient documentation

## 2022-03-25 DIAGNOSIS — M1A49X0 Other secondary chronic gout, multiple sites, without tophus (tophi): Secondary | ICD-10-CM

## 2022-03-25 DIAGNOSIS — I482 Chronic atrial fibrillation, unspecified: Secondary | ICD-10-CM

## 2022-03-25 DIAGNOSIS — N1832 Chronic kidney disease, stage 3b: Secondary | ICD-10-CM | POA: Diagnosis not present

## 2022-03-25 DIAGNOSIS — D696 Thrombocytopenia, unspecified: Secondary | ICD-10-CM | POA: Insufficient documentation

## 2022-03-25 DIAGNOSIS — N400 Enlarged prostate without lower urinary tract symptoms: Secondary | ICD-10-CM

## 2022-03-25 DIAGNOSIS — Z6839 Body mass index (BMI) 39.0-39.9, adult: Secondary | ICD-10-CM | POA: Diagnosis not present

## 2022-03-25 DIAGNOSIS — K219 Gastro-esophageal reflux disease without esophagitis: Secondary | ICD-10-CM

## 2022-03-25 DIAGNOSIS — Z8711 Personal history of peptic ulcer disease: Secondary | ICD-10-CM | POA: Insufficient documentation

## 2022-03-25 MED ORDER — FREESTYLE LIBRE 3 READER DEVI: 1.0000 | 0 refills | Status: AC

## 2022-03-25 MED ORDER — RSV PRE-FUSION F A&B VAC RCMB 120 MCG/0.5ML IM SOLR: 0.5000 mL | Freq: Once | INTRAMUSCULAR | 0 refills | Status: AC

## 2022-03-25 NOTE — Assessment & Plan Note (Signed)
His BP is currently controlled.  We will continue his current medications. 

## 2022-03-25 NOTE — Assessment & Plan Note (Signed)
He has morbid obesity associated with DM, HTN, HCL.  I want him to eat healthy, exercise as he can and lose weight.

## 2022-03-25 NOTE — Assessment & Plan Note (Signed)
Continue on his PPI at this time.

## 2022-03-25 NOTE — Assessment & Plan Note (Signed)
Plan as above.  

## 2022-03-25 NOTE — Assessment & Plan Note (Signed)
He seems to not be having problems today.  We will monitor and check his PSA today.

## 2022-03-25 NOTE — Assessment & Plan Note (Signed)
He shows no evidence of decompensation.  The edema in his legs is from his chronic venous insufficiency.  We will continue to monitor.

## 2022-03-25 NOTE — Assessment & Plan Note (Signed)
His thrombocytopenia has been stable.  We will continue to follow.

## 2022-03-25 NOTE — Assessment & Plan Note (Addendum)
His goal A1c is <7%.  We may need to try him on ozempic again if his sugars are not controlled.  His diabetic foot exam was normal. We will check his urine studies.  He is being set up for the free style libre.  We will refer him to optometry for his yearly eye exam.

## 2022-03-25 NOTE — Assessment & Plan Note (Signed)
We will check his FLP today.  He may need to be switched to lipitor.  His goal LDL is <100.

## 2022-03-25 NOTE — Assessment & Plan Note (Signed)
He does have some edema form his venous insufficiency today.   I want him to wear his TED hose at this time.  He needs to avoid salt and elevate his feet.

## 2022-03-25 NOTE — Progress Notes (Signed)
Preventive Screening-Counseling & Management     Dennis Nunez is a 77 y.o. male who presents for Medicare Annual/Subsequent preventive examination.  Dennis Nunez is a 77 year old Caucasian/White male who presents for his annual health maintenance exam. This patient's past medical history Atrial Fibrillation, Benign Prostatic Hypertrophy, Cardiomyopathy, Chronic kidney disease, Stage III (moderate), Chronic Left Bundle Branch Block, Congestive Heart Failure, Diabetes Mellitus, Type II, Uncontrolled, Diabetes Nephropathy, Gastric Ulcer, GERD, Hyperlipidemia, Hypertension, Benign Essential, Paroxysmal Atrial Tachycardia, and Venous Insufficiency.   His last dilated eye exam was in 01/30/2021 by Dr. Alexandria Nunez and they are following him for bilateral cataracts but there was no diabetic retinopathy. He has a history of PUD with a questionable duodenal ulcer diagnosed about 25 years ago. He does have reflux where he did see GI in 12/2018 and remains on omeprazole. He had an EGD on 12/2013 and this showed mild gastritis. Dr. Odie Sera wanted him to continue omeprazole and his iron and followup with regular blood counts every 3 months. He denies any problems with swallowing. His last colonoscopy was performed on 10/2017 and this showed internal hemorrhoids but was otherwise normal. They want to repeat his colonoscopy in 5 years. His prior colonoscopy was done in 09/2012 which showed internal hemorrhoids but was normal. They want him to repeat this in 2019 since he has had polyps in the past. He denies any melena, BRBPR, or other problems. He states he is exercising regularly where he goes to the [facility] gym 1-2 times per week. The patient does have yearly flu vaccines. He states he had pneumovax 23 vaccine in 2014. He had his prevnar 13 vaccine 03/2020. He completed his shingrix vaccine in 08/2021.  He is interested in the RSV vaccine.  He has had 4 COVID-19 vaccines including 2 boosters. He denies any  depression, anxiety or memory loss. The patient is on an ASA 81mg  daily.   The patient is a 77 year old Caucasian/White male who returns for a follow-up visit for his T2 diabetes.  On his last visit, his A1c was not completely controlled and we asked him to increase his tresiba from 38 Units to 40 Units daily.  We did start him on ozempic in the past but he had to stop it as he was experiencing some mild diarrhea but no nausea or vomiting.  This past year, I started him on farxiga for diabetic nephropathy.  He has had symptoms of hypoglycemia in the past where he was admitted to [facility] in 01/2021 for presyncope.  This was due to him taking his insulin and not eating and he was also having diarrhea at that time.  He remains on tresiba 40 units daily and humalog 15 units before meals which is usually twice per day as well as farxiga 10mg  daily. He is not walking as much as they would like.  He specifically denies unexplained abdominal pain, nausea or vomiting, or documented hypoglycemia where he has had no more hypoglycemia since his hospitalization in 01/2021. He checks blood sugars at least once per day and they tend to range somewhere between 120 and 150's mg/dl fasting.  He saw the clinical pharmacist from [facility] a few months ago and she recommended [place] starting a CGM on him with freestyle libre.  He has not started this yet as the pharmacy has not received the reader.  He came in fasting today in anticipation of lab work. His last HgbA1c was done 3 months ago and was 7.2%. He has no  diabetic retinopathy or neuropathy but he does have probable diabetic and hypertensive nephropathy with Stage IIIb chronic kidney disease.     The patient is a 77 year old Caucasian/White male who presents for a follow-up evaluation of hypertension.  Since his last visit, there has been no problems.  The patient has not been checking his blood pressure at home.  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  He uses a stationary bike for 30-40 min.   The patient's current medications include: lasix 40 mg BID, lisinopril 20 mg daily, and Toprol XL 25 mg daily. The patient has been tolerating his medications well. The patient denies any headache, visual changes, dizziness, lightheadness, chest pain, shortness of breath, and weakness/numbness.  He reports there have been no other symptoms noted.   Dennis Nunez returns today for routine followup on his cholesterol. On his last visit, his cholesterol was not fully controlled and I asked him to cut the fats in his diet and continue to exercise.  Overall, he states he is doing well and is without any complaints or problems at this time. He specifically denies chest pain, abdominal pain, nausea, diarrhea, fatigue or myalgias. He remains on dietary management as well as the following cholesterol lowering medications Niaspan Extended-Release 500 mg oral tablet extended release 24 hr, fish oil and pravastatin 40 mg qhs. He is fasting in anticipation for labs today.  He did see the [facility] pharmacist who felt his ASCVD score was >20% and she recommended placing him on high intensity statin.       He was discovered to have mild thrombocytopenia on annual blood work in 2021 where we have been monitoring this.  He had some low platelet counts a few years ago but this resolved.  The patient denies any bleeding or bruising today.  His platelet count on 02/2019 was 138 and repeat platelet count on 05/2019 it was 139.  On his yearly exam in 03/2020, his platelet count was 143.    Dennis Nunez also had surgery in 09/2017 where he was discharged after a laparoscopic cholecystectomy at [facility].  He was at church when he began having abdominal pain.  He was taken to the hospital where they did a CT scan of his abdomen and pelvis.  This showed cholelithiasis but no biliary ductal dilatation or CBD stones.  GI was consulted and there was no need for ERCP/MRCP.   General surgery was called and the patient underwent laparoscopic cholecystectomy without problems.   This is a 77 year old Caucasian/White male who has mild atrial fibrillation which was diagnosed in the 1970's duration and presents today for a status visit. His atrial fibrillation is controlled with therapy as summarized in the medication list and previous notes.  He remains on digoxin 125 mcg daily and Toprol XL 25mg  daily.  Specifically denied complaints: chest pain, palpitations, TIAs, orthopnea, exertional dyspnea, and syncope. Interval history: no healthcare visits with other providers since last seen in this office. Interval studies: none. Anticoagulation status: He remains on an ASA 81mg  daily. His [place] score is a 3.   He does have a history of chronic venous insuffiency of his lower legs. He has been wearing his TED Hose and he states as long as he wears them he does not have a problem with swelling. He does elevate his legs which does help.     This patient also has mild chronic diastolic  CHF which was diagnosed about 15 years ago. He has not had a recent ECHO. His CHF has been in a compensated state on medications as noted in the medication list. He has no change in baseline symptoms since the last visit. Specifically denied complaints: worsening orthopnea, worsening edema, and worsening exertional dyspnea. Interval history: no healthcare visits with other providers since last seen in this office. Interval studies: none.  He has not had any recent problems with his history of CHF.     Dennis Nunez also has a history of gout.  He has not had a a gout flare in the last year.  He saw a rheuamtologist around 2015 for followup of his gout.  He remains on allopurinol at this time.  His gout usually affects his right arm or right leg or his right big toe.  He remains on allopurinol 100mg  daily.  He believes soft drinks and hot dogs flare his gout.  Today, he is not having any problems.             Are there smokers in your home (other than you)? No  Risk Factors Current exercise habits:  as above   Dietary issues discussed: none   Depression Screen (Note: if answer to either of the following is "Yes", a more complete depression screening is indicated)   Over the past two weeks, have you felt down, depressed or hopeless? No  Over the past two weeks, have you felt little interest or pleasure in doing things? No  Have you lost interest or pleasure in daily life? No  Do you often feel hopeless? No  Do you cry easily over simple problems? No  Activities of Daily Living In your present state of health, do you have any difficulty performing the following activities?:  Driving? No Managing money?  No Feeding yourself? No Getting from bed to chair? No Climbing a flight of stairs? No Preparing food and eating?: No Bathing or showering? No Getting dressed: No Getting to the toilet? No Using the toilet:No Moving around from place to place: No In the past year have you fallen or had a near fall?:No   Are you sexually active?  No  Do you have more than one partner?  No  Hearing Difficulties: Yes Do you often ask people to speak up or repeat themselves? Yes Do you experience ringing or noises in your ears? No Do you have difficulty understanding soft or whispered voices? Yes   Do you feel that you have a problem with memory? No  Do you often misplace items? No  Do you feel safe at home?  No  Cognitive Testing  Alert? No  Normal Appearance?No  Oriented to person? No  Place? No   Time? No  Recall of three objects?  No  Can perform simple calculations? No  Displays appropriate judgment?No  Can read the correct time from a watch face?No  Fall Risk Prevention  Any stairs in or around the home? No  If so, are there any without handrails? No  Home free of loose throw rugs in walkways, pet beds, electrical cords, etc? No  Adequate lighting in your home to reduce risk of  falls? Yes  Use of a cane, walker or w/c? No    Time Up and Go  Was the test performed? Yes .  Length of time to ambulate 10 feet: 8 sec.   Gait steady and fast without use of assistive device    Advanced Directives have been  discussed with the patient? No   List the Names of Other Physician/Practitioners you currently use: Patient Care Team: Dennis Nunez, [name], MD as PCP - General (Internal Medicine) Dennis Peer, LCSW as Social Worker    Past Medical History:  Diagnosis Date   Atrial fibrillation [facility])    takes Dilt but not apparently [facility]   BPH (benign prostatic hyperplasia)    Cardiomyopathy ([place])    Chronic diastolic (congestive) heart failure ([place])    Chronic venous insufficiency    CKD (chronic kidney disease), stage III ([place])    Diabetic nephropathy associated with type 2 diabetes mellitus ([place])    Dyslipidemia    GERD (gastroesophageal reflux disease)    Gout    History of gastric ulcer    Hyperlipidemia    Left bundle branch block (LBBB)    Paroxysmal atrial tachycardia    Thrombocytopenia ([place])     Past Surgical History:  Procedure Laterality Date   CHOLECYSTECTOMY N/A 09/03/2017   Procedure: LAPAROSCOPIC CHOLECYSTECTOMY WITH INTRAOPERATIVE CHOLANGIOGRAM;  Surgeon: Donnie Mesa, MD;  Location: [place];  Service: General;  Laterality: N/A;   HERNIA REPAIR        Current Medications  Current Outpatient Medications  Medication Sig Dispense Refill   RSV bivalent vaccine (ABRYSVO) 120 MCG/0.5ML injection Inject 0.5 mLs into the muscle once for 1 dose. 0.5 mL 0   allopurinol (ZYLOPRIM) 100 MG tablet TAKE 2 TABLETS (200 MG) BY ORAL ROUTE ONCE DAILY FOR 30 DAYS 180 tablet 1   B-D ULTRAFINE III SHORT PEN 31G X 8 MM MISC Use as directed 100 each 5   cholecalciferol (VITAMIN D) 1000 units tablet Take 1,000 Units by mouth daily.     Continuous Blood Gluc Receiver (FREESTYLE LIBRE 3 READER) DEVI 1 kit by Does not apply route every 14 (fourteen) days. 1 each 0    Continuous Blood Gluc Sensor (FREESTYLE LIBRE 3 SENSOR) MISC Place 1 sensor on the skin every 14 days. Use to check glucose continuously 2 each 3   digoxin (LANOXIN) 0.125 MG tablet Take 1 tablet (125 mcg total) by mouth daily. 90 tablet 1   FARXIGA 10 MG TABS tablet TAKE 1 TABLET (10 MG TOTAL) BY MOUTH EVERY MORNING. 90 tablet 0   ferrous sulfate 325 (65 FE) MG tablet Take 325 mg by mouth daily.  3   furosemide (LASIX) 40 MG tablet Take 1 tablet (40 mg total) by mouth 2 (two) times daily. 30 tablet 3   HUMALOG KWIKPEN 100 UNIT/ML KiwkPen Inject 10-11 Units into the skin 2 (two) times daily with a meal.  3   KLOR-CON M20 20 MEQ tablet TAKE 1 TABLET BY MOUTH 3 TIMES A DAY 270 tablet 1   lisinopril (PRINIVIL,ZESTRIL) 40 MG tablet Take 0.5 tablets (20 mg total) by mouth daily. 15 tablet 3   loperamide (IMODIUM) 2 MG capsule Take 4 mg by mouth as needed for diarrhea or loose stools.     metoprolol succinate (TOPROL-XL) 25 MG 24 hr tablet Take 1 tablet (25 mg total) by mouth daily. 90 tablet 0   niacin (VITAMIN B3) 500 MG ER tablet TAKE 1 TABLET BY MOUTH AT BEDTIME AFTER A LOW-FAT SNACK 90 tablet 1   omeprazole (PRILOSEC) 20 MG capsule Take 20 mg by mouth daily. Before breakfast  12   OZEMPIC, 0.25 OR 0.5 MG/DOSE, 2 MG/3ML SOPN Inject 0.5 mg as directed once a week. (Patient not taking: Reported on 02/26/2022) 3 mL 3   pravastatin (PRAVACHOL) 40 MG  tablet TAKE 1 TABLET BY MOUTH EVERY DAY AT BEDTIME AS DIRECTED 90 tablet 1   TRESIBA FLEXTOUCH 100 UNIT/ML SOPN FlexTouch Pen Inject 40 Units into the skin at bedtime.  11   No current facility-administered medications for this visit.    Allergies Aspirin and Penicillins   Social History Social History   Tobacco Use   Smoking status: Never   Smokeless tobacco: Never  Substance Use Topics   Alcohol use: Never     Review of Systems Review of Systems  Constitutional:  Negative for chills, fever and malaise/fatigue.  HENT:  Negative for hearing  loss.   Eyes:  Negative for blurred vision and double vision.  Respiratory:  Negative for cough, shortness of breath and wheezing.   Cardiovascular:  Negative for chest pain, palpitations and leg swelling.  Gastrointestinal:  Negative for abdominal pain, blood in stool, constipation, diarrhea, heartburn, melena, nausea and vomiting.  Genitourinary:  Negative for frequency and hematuria.  Musculoskeletal:  Negative for myalgias.  Skin:  Positive for rash. Negative for itching.  Neurological:  Negative for dizziness, weakness and headaches.  Psychiatric/Behavioral:  Negative for depression. The patient is not nervous/anxious.      Physical Exam:      Body mass index is 39.69 kg/m. BP 128/80   Pulse 63   Temp (!) 97.3 F (36.3 C)   Resp 16   Ht 5\' 11"  (1.803 m)   Wt 284 lb 9.6 oz (129.1 kg)   SpO2 98%   BMI 39.69 kg/m   Physical Exam Constitutional:      Appearance: Normal appearance. He is not ill-appearing.  HENT:     Head: Normocephalic and atraumatic.     Right Ear: Tympanic membrane, ear canal and external ear normal.     Left Ear: Tympanic membrane, ear canal and external ear normal.     Nose: Nose normal. No congestion or rhinorrhea.     Mouth/Throat:     Mouth: Mucous membranes are dry.     Pharynx: Oropharynx is clear. No oropharyngeal exudate or posterior oropharyngeal erythema.  Eyes:     General: No scleral icterus.    Conjunctiva/sclera: Conjunctivae normal.     Pupils: Pupils are equal, round, and reactive to light.  Neck:     Vascular: No carotid bruit.  Cardiovascular:     Rate and Rhythm: Normal rate and regular rhythm.     Pulses: Normal pulses.     Heart sounds: No murmur heard.    No friction rub. No gallop.  Pulmonary:     Effort: Pulmonary effort is normal. No respiratory distress.     Breath sounds: No wheezing, rhonchi or rales.  Abdominal:     General: Abdomen is flat. Bowel sounds are normal. There is no distension.     Palpations:  Abdomen is soft.     Tenderness: There is no abdominal tenderness.  Musculoskeletal:     Cervical back: Neck supple. No tenderness.     Right lower leg: No edema.     Left lower leg: No edema.  Lymphadenopathy:     Cervical: No cervical adenopathy.  Skin:    General: Skin is warm and dry.     Findings: No rash.  Neurological:     General: No focal deficit present.     Mental Status: He is alert and oriented to person, place, and time.  Psychiatric:        Mood and Affect: Mood normal.  Behavior: Behavior normal.      Assessment:      Diabetic nephropathy associated with type 2 diabetes mellitus ([place]) - Plan: Ambulatory referral to Optometry, Uric acid, Digoxin level, CBC with Differential/Platelet, CMP14 + Anion Gap, Hemoglobin A1c, Lipid panel, Magnesium, Microalbumin / creatinine urine ratio, TSH  Stage 3b chronic kidney disease (HCC)  Essential hypertension  Atrial fibrillation, chronic (HCC) - Plan: Magnesium  Gastroesophageal reflux disease, unspecified whether esophagitis present  Chronic venous insufficiency  Chronic diastolic CHF (congestive heart failure) (HCC)  Benign prostatic hyperplasia, unspecified whether lower urinary tract symptoms present - Plan: PSA  Thrombocytopenia (HCC)  LBBB (left bundle branch block)  History of gastric ulcer  Other secondary chronic gout of multiple sites without tophus - Plan: Uric acid  Hypercholesterolemia  BMI 39.0-39.9,adult  Morbid obesity ([place])    Plan:     During the course of the visit the patient was educated and counseled about appropriate screening and preventive services including:   Pneumococcal vaccine  Influenza vaccine Colorectal cancer screening  Diet review for nutrition referral? Yes ____  Not Indicated _X___   Patient Instructions (the written plan) was given to the patient.  Essential hypertension His BP is currently controlled.  We will continue his current  medications.  Chronic venous insufficiency He does have some edema form his venous insufficiency today.   I want him to wear his TED hose at this time.  He needs to avoid salt and elevate his feet.  Chronic diastolic CHF (congestive heart failure) ([place]) He shows no evidence of decompensation.  The edema in his legs is from his chronic venous insufficiency.  We will continue to monitor.  Atrial fibrillation, chronic ([place]) He is on rate control.  We will check his digoxin level today.  He is to continue his ASA.  Gastroesophageal reflux disease Continue on his PPI at this time.  Diabetic nephropathy associated with type 2 diabetes mellitus ([place]) His goal A1c is <7%.  We may need to try him on ozempic again if his sugars are not controlled.  His diabetic foot exam was normal. We will check his urine studies.  He is being set up for the free style libre.  We will refer him to optometry for his yearly eye exam.  CKD (chronic kidney disease), stage III ([place]) He remains on farxiga.  His CKD has been stable.  We will check his kidney functions and labs studies today and he is to avoid NSAIDS.  Benign prostatic hyperplasia He seems to not be having problems today.  We will monitor and check his PSA today.  Thrombocytopenia ([place]) His thrombocytopenia has been stable.  We will continue to follow.  Morbid obesity ([place]) He has morbid obesity associated with DM, HTN, HCL.  I want him to eat healthy, exercise as he can and lose weight.  Hypercholesterolemia We will check his FLP today.  He may need to be switched to lipitor.  His goal LDL is <100.  Gout We will continue his allopurinol and check a uric acid level.  BMI 39.0-39.9,adult Plan as above.   Prevention Health maintenance discussed.  He needs a colonoscopy later this year.  He wants the RSV vaccine.  I discussed the COVID-19 vaccine.  We will obtain some yearly labs.  Medicare Attestation I have personally reviewed: The patient's  medical and social history Their use of alcohol, tobacco or illicit drugs Their current medications and supplements The patient's functional ability including ADLs,fall risks, home safety risks, cognitive, and  hearing and visual impairment Diet and physical activities Evidence for depression or mood disorders  The patient's weight, height, and BMI have been recorded in the chart.  I have made referrals, counseling, and provided education to the patient based on review of the above and I have provided the patient with a written personalized care plan for preventive services.     Townsend Roger, MD   03/25/2022

## 2022-03-25 NOTE — Assessment & Plan Note (Signed)
He is on rate control.  We will check his digoxin level today.  He is to continue his ASA.

## 2022-03-25 NOTE — Assessment & Plan Note (Signed)
He remains on farxiga.  His CKD has been stable.  We will check his kidney functions and labs studies today and he is to avoid NSAIDS.

## 2022-03-25 NOTE — Assessment & Plan Note (Signed)
We will continue his allopurinol and check a uric acid level.

## 2022-03-28 LAB — CMP14 + ANION GAP
ALT: 22 IU/L (ref 0–44)
AST: 19 IU/L (ref 0–40)
Albumin/Globulin Ratio: 1.6 (ref 1.2–2.2)
Albumin: 4.4 g/dL (ref 3.8–4.8)
Alkaline Phosphatase: 84 IU/L (ref 44–121)
Anion Gap: 15 mmol/L (ref 10.0–18.0)
BUN/Creatinine Ratio: 17 (ref 10–24)
BUN: 30 mg/dL — ABNORMAL HIGH (ref 8–27)
Bilirubin Total: 0.5 mg/dL (ref 0.0–1.2)
CO2: 23 mmol/L (ref 20–29)
Calcium: 9 mg/dL (ref 8.6–10.2)
Chloride: 104 mmol/L (ref 96–106)
Creatinine, Ser: 1.73 mg/dL — ABNORMAL HIGH (ref 0.76–1.27)
Globulin, Total: 2.8 g/dL (ref 1.5–4.5)
Glucose: 135 mg/dL — ABNORMAL HIGH (ref 70–99)
Potassium: 4.9 mmol/L (ref 3.5–5.2)
Sodium: 142 mmol/L (ref 134–144)
Total Protein: 7.2 g/dL (ref 6.0–8.5)
eGFR: 40 mL/min/{1.73_m2} — ABNORMAL LOW (ref >59)

## 2022-03-28 LAB — LIPID PANEL
Chol/HDL Ratio: 5.2 ratio — ABNORMAL HIGH (ref 0.0–5.0)
Cholesterol, Total: 172 mg/dL (ref 100–199)
HDL: 33 mg/dL — ABNORMAL LOW (ref >39)
LDL Chol Calc (NIH): 96 mg/dL (ref 0–99)
Triglycerides: 253 mg/dL — ABNORMAL HIGH (ref 0–149)
VLDL Cholesterol Cal: 43 mg/dL — ABNORMAL HIGH (ref 5–40)

## 2022-03-28 LAB — CBC WITH DIFFERENTIAL/PLATELET
Basophils Absolute: 0.1 10*3/uL (ref 0.0–0.2)
Basos: 1 %
EOS (ABSOLUTE): 0.3 10*3/uL (ref 0.0–0.4)
Eos: 4 %
Hematocrit: 42.8 % (ref 37.5–51.0)
Hemoglobin: 13.9 g/dL (ref 13.0–17.7)
Immature Grans (Abs): 0 10*3/uL (ref 0.0–0.1)
Immature Granulocytes: 0 %
Lymphocytes Absolute: 1.8 10*3/uL (ref 0.7–3.1)
Lymphs: 24 %
MCH: 30.4 pg (ref 26.6–33.0)
MCHC: 32.5 g/dL (ref 31.5–35.7)
MCV: 94 fL (ref 79–97)
Monocytes Absolute: 0.5 10*3/uL (ref 0.1–0.9)
Monocytes: 7 %
Neutrophils Absolute: 4.8 10*3/uL (ref 1.4–7.0)
Neutrophils: 64 %
Platelets: 141 10*3/uL — ABNORMAL LOW (ref 150–450)
RBC: 4.57 x10E6/uL (ref 4.14–5.80)
RDW: 14.9 % (ref 11.6–15.4)
WBC: 7.5 10*3/uL (ref 3.4–10.8)

## 2022-03-28 LAB — MAGNESIUM: Magnesium: 2.5 mg/dL — ABNORMAL HIGH (ref 1.6–2.3)

## 2022-03-28 LAB — MICROALBUMIN / CREATININE URINE RATIO
Creatinine, Urine: 99.1 mg/dL
Microalb/Creat Ratio: 6 mg/g creat (ref 0–29)
Microalbumin, Urine: 6 ug/mL

## 2022-03-28 LAB — TSH: TSH: 2.47 u[IU]/mL (ref 0.450–4.500)

## 2022-03-28 LAB — HEMOGLOBIN A1C
Est. average glucose Bld gHb Est-mCnc: 180 mg/dL
Hgb A1c MFr Bld: 7.9 % — ABNORMAL HIGH (ref 4.8–5.6)

## 2022-03-28 LAB — PSA: Prostate Specific Ag, Serum: 2.7 ng/mL (ref 0.0–4.0)

## 2022-03-28 LAB — URIC ACID: Uric Acid: 5.1 mg/dL (ref 3.8–8.4)

## 2022-03-28 LAB — DIGOXIN LEVEL: Digoxin, Serum: 0.7 ng/mL (ref 0.5–0.9)

## 2022-03-31 ENCOUNTER — Ambulatory Visit: Payer: Self-pay

## 2022-03-31 NOTE — Patient Outreach (Signed)
  Care Coordination   03/31/2022 Name: Dennis Nunez MRN: [id number] DOB: Nov 27, 1945   Care Coordination Outreach Attempts:  An unsuccessful telephone outreach was attempted for a scheduled appointment today.  Follow Up Plan:  Additional outreach attempts will be made to offer the patient care coordination information and services.   Encounter Outcome:  No Answer   Care Coordination Interventions:  No, not indicated    Tomasa Rand, RN, BSN, [facility] [employer] [phone]

## 2022-04-01 ENCOUNTER — Ambulatory Visit: Payer: Self-pay | Admitting: *Deleted

## 2022-04-01 ENCOUNTER — Ambulatory Visit: Payer: Self-pay

## 2022-04-01 NOTE — Patient Outreach (Signed)
  Care Coordination   Follow Up Visit Note   04/01/2022 Name: Dennis Nunez MRN: [id number] DOB: 19-Nov-1945  Dennis Nunez is a 77 y.o. year old male who sees Townsend Roger, MD for primary care. I spoke with  [name] by phone today.  What matters to the patients health and wellness today?  Follow up call to patient about his CBG reader. Patient reports that he has his reader with no out of pocket cost.  Patient was very thankful for assistance.  Denies any other needs at this time.     Goals Addressed             This Visit's Progress    COMPLETED: I need to get a blood glucose reader       Interventions Today    Flowsheet Row Most Recent Value  Chronic Disease   Chronic disease during today's visit Diabetes  General Interventions   General Interventions Discussed/Reviewed Durable Medical Equipment (DME)  Durable Medical Equipment (DME) Glucomoter  [Patient has his reader and denies any other concerns. Encouraged patient to call me if he needs assistance.]              SDOH assessments and interventions completed:  No     Care Coordination Interventions:  Yes, provided   Follow up plan: No further intervention required.   Encounter Outcome:  Pt. Visit Completed   Tomasa Rand, RN, BSN, CEN [place] Coordinator [phone]

## 2022-04-01 NOTE — Patient Outreach (Signed)
  Care Coordination   Follow Up Visit Note   04/01/2022 Name: Dennis Nunez MRN: [id number] DOB: April 25, 1945  Dennis Nunez is a 77 y.o. year old male who sees Townsend Roger, MD for primary care. I spoke with  [name] by phone today.  What matters to the patients health and wellness today?  Got my diabetes machine and going to use it today.    Goals Addressed             This Visit's Progress    Reduce financial strains       Activities and task to complete in order to accomplish goals.   Go to [employer] and DSS to follow up on assistance as discussed Continue to use Physicist, medical and [employer] card to aide in budgeting/bills etc Continue with your church activities and with local friends Glad you were able to get the DM machine with no cost to you Glad you were able to get church/friend assistance to fill your oil heat tank to get you through to next Fall        SDOH assessments and interventions completed:  Yes     Care Coordination Interventions:  Yes, provided  Interventions Today    Flowsheet Row Most Recent Value  Chronic Disease   Chronic disease during today's visit Diabetes  Education Interventions   Provided Verbal Education On [employer]  [Pt going to [employer] and DSS to apply for assistnace]       Follow up plan: Follow up call scheduled for 04/12/22    Encounter Outcome:  Pt. Visit Completed

## 2022-04-01 NOTE — Patient Instructions (Signed)
Visit Information  Thank you for taking time to visit with me today. Please don't hesitate to contact me if I can be of assistance to you.   Following are the goals we discussed today:   Goals Addressed             This Visit's Progress    Reduce financial strains       Activities and task to complete in order to accomplish goals.   Go to [employer] and DSS to follow up on assistance as discussed Continue to use Food Stamps and [facility] card to aide in budgeting/bills etc Continue with your church activities and with local friends Glad you were able to get the DM machine with no cost to you Glad you were able to get church/friend assistance to fill your oil heat tank to get you through to next Fall        Our next appointment is by telephone on 04/12/22    Please call the care guide team at [phone] if you need to cancel or reschedule your appointment.   If you are experiencing a Mental Health or [place] or need someone to talk to, please call the Suicide and Crisis Lifeline: 988 call 911   The patient verbalized understanding of instructions, educational materials, and care plan provided today and DECLINED offer to receive copy of patient instructions, educational materials, and care plan.   Telephone follow up appointment with care management team member scheduled for:04/12/22  Eduard Clos, MSW, [place] Work[employer] [phone]

## 2022-04-12 ENCOUNTER — Ambulatory Visit: Payer: Self-pay | Admitting: *Deleted

## 2022-04-12 NOTE — Patient Outreach (Signed)
  Care Coordination   Follow Up Visit Note   04/12/2022 Name: Dennis Nunez MRN: [id number] DOB: [birth year]  Dennis Nunez is a 77 y.o. year old male who sees Crist Fat, MD for primary care. I spoke with  [place] by phone today.  What matters to the patients health and wellness today?  Church paid my bill and I am appreciative and ok now.    Goals Addressed             This Visit's Progress    COMPLETED: Reduce financial strains       Activities and task to complete in order to accomplish goals.   Remember to reach out to [employer] and DSS for further assistance when needed as discussed Continue to use [employer] and [employer] card to aide in budgeting/bills etc Continue with your church activities and with local friends Glad you were able to get the diabetic machine with no cost to you Glad you were able to get church/friend assistance to fill your oil heat tank to get you through to next Fall        SDOH assessments and interventions completed:  Yes     Care Coordination Interventions:  Yes, provided  Interventions Today    Flowsheet Row Most Recent Value  Chronic Disease   Chronic disease during today's visit Diabetes  General Interventions   General Interventions Discussed/Reviewed [employer] has resources for seeking financial support in the future if needed]       Follow up plan: No further intervention required.   Encounter Outcome:  Pt. Visit Completed

## 2022-04-14 ENCOUNTER — Other Ambulatory Visit: Payer: Self-pay | Admitting: Internal Medicine

## 2022-04-16 ENCOUNTER — Other Ambulatory Visit: Payer: Self-pay | Admitting: Internal Medicine

## 2022-05-12 ENCOUNTER — Other Ambulatory Visit: Payer: Self-pay | Admitting: Internal Medicine

## 2022-05-25 ENCOUNTER — Other Ambulatory Visit: Payer: Self-pay | Admitting: Internal Medicine

## 2022-06-01 DIAGNOSIS — E119 Type 2 diabetes mellitus without complications: Secondary | ICD-10-CM | POA: Diagnosis not present

## 2022-06-14 ENCOUNTER — Telehealth: Payer: Self-pay

## 2022-06-14 NOTE — Progress Notes (Incomplete)
CMCS - FreeStyle Libre  Copied from [employer]:  "06/11/22 - No response. Left detailed VM for pt to call back. In my message, I explained that since we have not heard from him regarding CGM training, we are going to assume that he no longer needs training. I told him that we would like to schedule a follow-up visit or call with Sushama. Pt's OV with Dr. Leonia Reader is still scheduled on 6/25. - JHM, LPN."  Pt Call - Ozempic. Libre  Start: 06/11/22, 1:44 PM. Pt returned my call. He apologized for not calling us sooner; he "just forgot". We scheduled a CMCS f/u OV on 07/23/22. I reminded him of his appt with Dr. Leonia Reader on 06/29/22.  Pt stated, "I'm wearing my sensor and I have my reader. So far, so good. I just put a new sensor on today. I'll have to go back to the pharmacy in a couple of weeks and get more. It's nice to not have to prick your fingers every minute. I do wish that it was easier to read the graph. I want to look at the number; the graph isn't definite. Sometimes I have to scan my arm 2-3 times to see the number instead of the graph. Maybe Linden Dolin can look at that and help me with it.  "I'm going to go off of my Ozempic for a couple of days. I've had diarrhea a couple of times real bad, and I want to see if that medicine is causing it. I have it so bad with gas and bloating. I took a couple of Imodium, but now I'm kind of constipated. I might have to take some Duloclax or something like that. I don't know if it's the medicine or if I'm getting some bad food. I've heard on TV that Ozempic can cause diarrhea. I hate that; you feel like you can't go anywhere. I was on the 0.25mg  dose and didn't have a problem with it. I thought I could bring it up to the 0.50mg . That's what I took last week. I think I'll take a break for a couple of days and then try doing the 0.25mg  again. It gives you a feeling that you're full. I'll try to curb my appetite and not eat much. I've lost about 10lb, and my sugar has come  down about 30 points or better.   "I usually take [Ozempic] Thursday night, an hour or two before I take the last insulin of the day Evaristo Bury). I skipped it last night. Two Sunday mornings ago, it got down real low, to 62mg /dL. I didn't go to church; I felt like I had to stay home and get my sugar low. The sensor alarmed on me. I stayed home, ate some ice cream and drank some juice, and it got back up. Last night, my sugar was 157mg /dL, and it was 127mg /dL this morning. I've had it mostly [around] 119mg /dL, 115mg /dL, 09W...before I went on the Ozempic, it'd be like 157mg /dL when I'd get up. It'd be kind of high, and I think my A1c went up from 6.9 to 7.9. It went up a whole point. Dr. Gavin Potters checked my eyes for diabetes. So far, I'm doing all right; diabetes hasn't affected my eyes yet. He said, 'don't let the A1c get over 8, or you might start running into some problems.' I'll give Dr. Leonia Reader that report when I see him.  "I hate to take so much stuff - I'm on so many old medicines - but  I'm not going to shirk my responsibilities and not take my medicine. I know I'm taking them for a reason. The Ozempic is helping me; I just hate to be so full of gas and diarrhea."  I made Sushama aware of pt's plan. I told him that he may take a break from Ozempic for a couple of days, allow his GI symptoms to subside, then resume taking the 0.25mg  dose. Pt stated, "I appreciate you saying that. I enjoyed talking to you." I encouraged him to call me PRN. Total time: 57 minutes (non-billable time). Anne Ng, LPN.

## 2022-06-26 ENCOUNTER — Other Ambulatory Visit: Payer: Self-pay | Admitting: Internal Medicine

## 2022-06-29 ENCOUNTER — Encounter: Payer: Self-pay | Admitting: Internal Medicine

## 2022-06-29 ENCOUNTER — Ambulatory Visit: Payer: 59 | Admitting: Internal Medicine

## 2022-06-29 VITALS — BP 122/64 | HR 72 | Temp 97.3°F | Resp 18 | Ht 71.0 in | Wt 276.4 lb

## 2022-06-29 DIAGNOSIS — I10 Essential (primary) hypertension: Secondary | ICD-10-CM

## 2022-06-29 DIAGNOSIS — N1832 Chronic kidney disease, stage 3b: Secondary | ICD-10-CM

## 2022-06-29 DIAGNOSIS — E1121 Type 2 diabetes mellitus with diabetic nephropathy: Secondary | ICD-10-CM

## 2022-06-29 NOTE — Assessment & Plan Note (Signed)
His BP is currently controlled.  We will continue his current BP meds. 

## 2022-06-29 NOTE — Assessment & Plan Note (Signed)
His A1c on his last visit 7.9% where we had him restart ozempic.  I had a discussion about diet and exercise.  I do not want him increasing his ozempic but I want him to keep at 0.25mg  weekly.  He had a diabetic eye exam done 2 months ago and we will get these results.  We will check a hgBa1c on him today.

## 2022-06-29 NOTE — Progress Notes (Unsigned)
Office Visit  Subjective   Patient ID: Dennis Nunez   DOB: 12/23/1945   Age: 77 y.o.   MRN: [id number]   Chief Complaint Chief Complaint  Patient presents with   Follow-up     History of Present Illness The patient is a 77 year old Caucasian/White male who returns for a follow-up visit for his T2 diabetes.  On his last visit, his A1c was not controlled and I asked him to restart ozempic.  He had problems with diarrhea in the past on ozempic but we had him start at 0.25mg  once a week and he had no problems.  This past week he for some reason went to the 0.5mg  and began having diarrhea.  This past year, I started him on farxiga for diabetic nephropathy.  He has had symptoms of hypoglycemia in the past where he was admitted to [facility] in 01/2021 for presyncope.  This was due to him taking his insulin and not eating and he was also having diarrhea at that time.  He remains on tresiba 40 units daily and humalog 15 units before meals which is usually twice per day as well as farxiga 10mg  daily and ozempic 0.25mg  subcut weekly.  He is not walking as much as they would like.  He specifically denies unexplained abdominal pain, nausea or vomiting, or documented hypoglycemia where he has had no more hypoglycemia since his hospitalization in 01/2021.  We did obtain a freestyle libre on him since his last visit and he states his blood sugars are running 100-120.  His last HgbA1c was done 3 months ago and was 7.9%. He has no diabetic retinopathy or neuropathy but he does have probable diabetic and hypertensive nephropathy with Stage IIIb chronic kidney disease.    The patient has Stage IIIb CKD which is probably due to his T2 diabetes and his hypertension.  His creatinine has ranged 1.4-1.6 with a GFR 33-45 where he has been noted by our practice to have CKD since 2011.  He is currently on farxiga and lisinopril.  He has been avoiding NSAIDS.   The patient is a 77 year old Caucasian/White male who presents for a  follow-up evaluation of hypertension.  Since his last visit, there has been no problems.  The patient has been checking his blood pressure at home.  He states his systolic BP runs 409-811'B.   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  He uses a stationary bike for 30-40 min.   The patient's current medications include: lasix 40 mg BID, lisinopril 20 mg daily, and Toprol XL 25 mg daily. The patient has been tolerating his medications well. The patient denies any headache, visual changes, dizziness, lightheadness, chest pain, shortness of breath, and weakness/numbness.  He reports there have been no other symptoms noted.         Past Medical History Past Medical History:  Diagnosis Date   Atrial fibrillation (HCC)    takes Dilt but not apparently AC   BPH (benign prostatic hyperplasia)    Cardiomyopathy (HCC)    Chronic diastolic (congestive) heart failure (HCC)    Chronic venous insufficiency    CKD (chronic kidney disease), stage III (HCC)    Diabetic nephropathy associated with type 2 diabetes mellitus (HCC)    Dyslipidemia    GERD (gastroesophageal reflux disease)    Gout    History of gastric ulcer    Hyperlipidemia    Left bundle branch  block (LBBB)    Paroxysmal atrial tachycardia    Thrombocytopenia (HCC)      Allergies Allergies  Allergen Reactions   Penicillins Hives and Rash    Has patient had a PCN reaction causing immediate rash, facial/tongue/throat swelling, SOB or lightheadedness with hypotension: No  Has patient had a PCN reaction causing severe rash involving mucus membranes or skin necrosis: No  Has patient had a PCN reaction that required hospitalization: No  Has patient had a PCN reaction occurring within the last 10 years: No  If all of the above answers are "NO", then may proceed with Cephalosporin use.   Aspirin      Medications  Current Outpatient Medications:    allopurinol (ZYLOPRIM) 100 MG  tablet, TAKE 2 TABLETS (200 MG) BY ORAL ROUTE ONCE DAILY FOR 30 DAYS, Disp: 180 tablet, Rfl: 1   B-D ULTRAFINE III SHORT PEN 31G X 8 MM MISC, Use as directed, Disp: 100 each, Rfl: 5   cholecalciferol (VITAMIN D) 1000 units tablet, Take 1,000 Units by mouth daily., Disp: , Rfl:    Continuous Blood Gluc Receiver (FREESTYLE LIBRE 3 READER) DEVI, 1 kit by Does not apply route every 14 (fourteen) days., Disp: 1 each, Rfl: 0   Continuous Blood Gluc Sensor (FREESTYLE LIBRE 3 SENSOR) MISC, Place 1 sensor on the skin every 14 days. Use to check glucose continuously, Disp: 2 each, Rfl: 3   digoxin (LANOXIN) 0.125 MG tablet, Take 1 tablet (125 mcg total) by mouth daily., Disp: 90 tablet, Rfl: 1   FARXIGA 10 MG TABS tablet, TAKE 1 TABLET (10 MG TOTAL) BY MOUTH EVERY MORNING., Disp: 90 tablet, Rfl: 0   ferrous sulfate 325 (65 FE) MG tablet, Take 325 mg by mouth daily., Disp: , Rfl: 3   furosemide (LASIX) 40 MG tablet, TAKE 1 TABLET BY MOUTH TWICE A DAY, Disp: 60 tablet, Rfl: 1   insulin degludec (TRESIBA FLEXTOUCH) 100 UNIT/ML FlexTouch Pen, Inject 40 Units into the skin at bedtime., Disp: 3 mL, Rfl: 11   insulin lispro (HUMALOG KWIKPEN) 100 UNIT/ML KwikPen, INJECT 10 UNITS UNDER THE SKIN BEFORE EACH MEAL, Disp: 15 mL, Rfl: 3   KLOR-CON M20 20 MEQ tablet, TAKE 1 TABLET BY MOUTH THREE TIMES A DAY, Disp: 270 tablet, Rfl: 1   lisinopril (ZESTRIL) 40 MG tablet, TAKE 1/2 TABLET BY MOUTH EVERY DAY, Disp: 45 tablet, Rfl: 7   loperamide (IMODIUM) 2 MG capsule, Take 4 mg by mouth as needed for diarrhea or loose stools., Disp: , Rfl:    metoprolol succinate (TOPROL-XL) 25 MG 24 hr tablet, TAKE 1 TABLET (25 MG TOTAL) BY MOUTH DAILY., Disp: 90 tablet, Rfl: 0   niacin (VITAMIN B3) 500 MG ER tablet, TAKE 1 TABLET BY MOUTH AT BEDTIME AFTER A LOW-FAT SNACK, Disp: 90 tablet, Rfl: 1   omeprazole (PRILOSEC) 20 MG capsule, Take 20 mg by mouth daily. Before breakfast, Disp: , Rfl: 12   OZEMPIC, 0.25 OR 0.5 MG/DOSE, 2 MG/3ML SOPN,  INJECT 0.5 MG AS DIRECTED ONE TIME PER WEEK, Disp: 3 mL, Rfl: 3   pravastatin (PRAVACHOL) 40 MG tablet, TAKE 1 TABLET BY MOUTH EVERY DAY AT BEDTIME AS DIRECTED, Disp: 90 tablet, Rfl: 1   Review of Systems Review of Systems  Constitutional:  Negative for chills and fever.  Eyes:  Negative for blurred vision and double vision.  Respiratory:  Negative for cough, shortness of breath and wheezing.   Cardiovascular:  Positive for leg swelling. Negative for chest pain and palpitations.  Gastrointestinal:  Positive for diarrhea. Negative for abdominal pain, constipation, nausea and vomiting.  Genitourinary:  Negative for frequency.  Musculoskeletal:  Negative for myalgias.  Skin:  Negative for itching and rash.  Neurological:  Negative for dizziness, weakness and headaches.  Endo/Heme/Allergies:  Negative for polydipsia.       Objective:    Vitals BP 122/64 (BP Location: Right Arm, Patient Position: Sitting, Cuff Size: Large)   Pulse 72   Temp (!) 97.3 F (36.3 C) (Temporal)   Resp 18   Ht 5\' 11"  (1.803 m)   Wt 276 lb 6.4 oz (125.4 kg)   SpO2 98%   BMI 38.55 kg/m    Physical Examination Physical Exam Constitutional:      Appearance: Normal appearance. He is not ill-appearing.  Cardiovascular:     Rate and Rhythm: Normal rate and regular rhythm.     Pulses: Normal pulses.     Heart sounds: No murmur heard.    No friction rub. No gallop.  Pulmonary:     Effort: Pulmonary effort is normal. No respiratory distress.     Breath sounds: No wheezing, rhonchi or rales.  Abdominal:     General: Abdomen is flat. Bowel sounds are normal. There is no distension.     Palpations: Abdomen is soft.     Tenderness: There is no abdominal tenderness.  Musculoskeletal:     Right lower leg: Edema present.     Left lower leg: Edema present.     Comments: He has chronic venous insufficiency with swelling of his feet, ankles and he has bronzing of the skin of his anterior shins bilaterally.   Skin:    General: Skin is warm and dry.     Findings: No rash.  Neurological:     General: No focal deficit present.     Mental Status: He is alert and oriented to person, place, and time.  Psychiatric:        Mood and Affect: Mood normal.        Behavior: Behavior normal.        Assessment & Plan:   Essential hypertension His BP is currently controlled.  We will continue his current BP meds.  CKD (chronic kidney disease), stage III (HCC) I want him to avoid NSAIDS.  His kidney function has been stable.  Diabetic nephropathy associated with type 2 diabetes mellitus (HCC) His A1c on his last visit 7.9% where we had him restart ozempic.  I had a discussion about diet and exercise.  I do not want him increasing his ozempic but I want him to keep at 0.25mg  weekly.  He had a diabetic eye exam done 2 months ago and we will get these results.  We will check a hgBa1c on him today.    Return in about 3 months (around 09/29/2022).   Crist Fat, MD

## 2022-06-29 NOTE — Assessment & Plan Note (Signed)
I want him to avoid NSAIDS.  His kidney function has been stable.

## 2022-06-30 LAB — HEMOGLOBIN A1C
Est. average glucose Bld gHb Est-mCnc: 154 mg/dL
Hgb A1c MFr Bld: 7 % — ABNORMAL HIGH (ref 4.8–5.6)

## 2022-07-02 ENCOUNTER — Other Ambulatory Visit: Payer: Self-pay

## 2022-07-02 MED ORDER — TRESIBA FLEXTOUCH 100 UNIT/ML ~~LOC~~ SOPN: 44.0000 [IU] | PEN_INJECTOR | Freq: Every day | SUBCUTANEOUS | 11 refills | Status: DC

## 2022-07-29 ENCOUNTER — Other Ambulatory Visit: Payer: Self-pay | Admitting: Internal Medicine

## 2022-08-02 ENCOUNTER — Other Ambulatory Visit: Payer: Self-pay | Admitting: Internal Medicine

## 2022-08-21 ENCOUNTER — Other Ambulatory Visit: Payer: Self-pay | Admitting: Internal Medicine

## 2022-08-30 DIAGNOSIS — W19XXXA Unspecified fall, initial encounter: Secondary | ICD-10-CM | POA: Diagnosis not present

## 2022-08-30 DIAGNOSIS — E860 Dehydration: Secondary | ICD-10-CM | POA: Diagnosis not present

## 2022-08-30 DIAGNOSIS — M25422 Effusion, left elbow: Secondary | ICD-10-CM | POA: Diagnosis not present

## 2022-08-30 DIAGNOSIS — S52045A Nondisplaced fracture of coronoid process of left ulna, initial encounter for closed fracture: Secondary | ICD-10-CM | POA: Diagnosis not present

## 2022-08-30 DIAGNOSIS — S42402A Unspecified fracture of lower end of left humerus, initial encounter for closed fracture: Secondary | ICD-10-CM | POA: Diagnosis not present

## 2022-08-30 DIAGNOSIS — Z743 Need for continuous supervision: Secondary | ICD-10-CM | POA: Diagnosis not present

## 2022-08-30 DIAGNOSIS — I447 Left bundle-branch block, unspecified: Secondary | ICD-10-CM | POA: Diagnosis not present

## 2022-08-30 DIAGNOSIS — R9431 Abnormal electrocardiogram [ECG] [EKG]: Secondary | ICD-10-CM | POA: Diagnosis not present

## 2022-08-30 DIAGNOSIS — R404 Transient alteration of awareness: Secondary | ICD-10-CM | POA: Diagnosis not present

## 2022-08-30 DIAGNOSIS — S42402K Unspecified fracture of lower end of left humerus, subsequent encounter for fracture with nonunion: Secondary | ICD-10-CM | POA: Diagnosis not present

## 2022-08-30 DIAGNOSIS — S52043A Displaced fracture of coronoid process of unspecified ulna, initial encounter for closed fracture: Secondary | ICD-10-CM | POA: Diagnosis not present

## 2022-08-30 DIAGNOSIS — R55 Syncope and collapse: Secondary | ICD-10-CM | POA: Diagnosis not present

## 2022-08-30 DIAGNOSIS — R6889 Other general symptoms and signs: Secondary | ICD-10-CM | POA: Diagnosis not present

## 2022-09-02 DIAGNOSIS — S52042A Displaced fracture of coronoid process of left ulna, initial encounter for closed fracture: Secondary | ICD-10-CM | POA: Diagnosis not present

## 2022-09-03 ENCOUNTER — Ambulatory Visit: Payer: 59 | Admitting: Internal Medicine

## 2022-09-03 ENCOUNTER — Encounter: Payer: Self-pay | Admitting: Internal Medicine

## 2022-09-03 VITALS — BP 118/60 | HR 78 | Temp 97.5°F | Resp 17 | Ht 71.0 in | Wt 279.4 lb

## 2022-09-03 DIAGNOSIS — I951 Orthostatic hypotension: Secondary | ICD-10-CM | POA: Diagnosis not present

## 2022-09-03 MED ORDER — FUROSEMIDE 40 MG PO TABS: 40.0000 mg | ORAL_TABLET | Freq: Every day | ORAL | Status: DC

## 2022-09-03 NOTE — Assessment & Plan Note (Addendum)
He is orthostatic on his orthostatic check today where his HR went from 74 to 93 with lying to sitting.  I think he was working in the heat and he is taking lasix 40mg  BID and he got over heated and had some dehydration that caused him to have syncope.  I check his EKG today and this shows sinus rhythm with a LBBB and no evidence of A. Fib today.  We are going to cut his lasix back to lasix 40mg  only once per day.

## 2022-09-03 NOTE — Progress Notes (Signed)
Office Visit  Subjective   Patient ID: Dennis Nunez   DOB: 1945/07/18   Age: 77 y.o.   MRN: [id number]   Chief Complaint Chief Complaint  Patient presents with   Follow-up    E.R. follow up     History of Present Illness Dennis Nunez is a 77 yo male who comes in today for a ER followup where he was seen at [facility] ER on 08/30/2022 due to syncope.  He states he was weed eating in his yard on the above date when he began feel lightheaded.  He had a syncopal episode at that time and states he blacked out and thinks he was out for about 10 min.  He brought in to the ER and told them he has had problems in the past with lightheadedness/dizziness.  He also hit his left elbow where a xray showed a linear nondisplaced fracture of the coronoid process of the ulna.  They did an EKG at ER and this showed NSR with a LBBB with a HR of 90.  His BP was noted to be 99/55 in the ER at one point.  They discharged home and he has seen orthopedics for his elbow as an outpatient and was told this was a crack and not a fracture and told him it will heal on it's own.  They are starting him on physical therapy.  In regards to his syncope, he states his only prodromal symptom was this dizziness and then he passed out.  He denied any chest pain, palpitations, SOB, diaphoresis, nausea, vomiting, blurred vision or other problems.  He states he will get occasional dizziness when he gets up from sitting to standing where he has to stand there for a bit to equalize.  The patient does have a history of HTN where he checks his BP at home and this usually runs in systolic BP of 120-140's.   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  He uses a stationary bike for 30-40 min.   The patient's current medications include: lasix 40 mg BID, lisinopril 20 mg daily, and Toprol XL 25 mg daily. The patient has been tolerating his medications well. The patient denies any headache, visual changes,  dizziness, lightheadness, chest pain, shortness of breath, and weakness/numbness.  He reports there have been no other symptoms noted.  He does have a history of A. Fib, diastolic dysfunction  and CKD.     Past Medical History Past Medical History:  Diagnosis Date   Atrial fibrillation (HCC)    takes Dilt but not apparently AC   BPH (benign prostatic hyperplasia)    Cardiomyopathy (HCC)    Chronic diastolic (congestive) heart failure (HCC)    Chronic venous insufficiency    CKD (chronic kidney disease), stage III (HCC)    Diabetic nephropathy associated with type 2 diabetes mellitus (HCC)    Dyslipidemia    GERD (gastroesophageal reflux disease)    Gout    History of gastric ulcer    Hyperlipidemia    Left bundle branch block (LBBB)    Paroxysmal atrial tachycardia    Thrombocytopenia (HCC)      Allergies Allergies  Allergen Reactions   Penicillins Hives and Rash    Has patient had a PCN reaction causing immediate rash, facial/tongue/throat swelling, SOB or lightheadedness with hypotension: No  Has patient had a PCN reaction causing severe rash involving mucus membranes or skin necrosis: No  Has patient  had a PCN reaction that required hospitalization: No  Has patient had a PCN reaction occurring within the last 10 years: No  If all of the above answers are "NO", then may proceed with Cephalosporin use.   Aspirin      Medications  Current Outpatient Medications:    Accu-Chek Softclix Lancets lancets, USE AS DIRECTED, Disp: 100 each, Rfl: 5   allopurinol (ZYLOPRIM) 100 MG tablet, TAKE 2 TABLETS (200 MG) BY ORAL ROUTE ONCE DAILY FOR 30 DAYS, Disp: 180 tablet, Rfl: 1   B-D ULTRAFINE III SHORT PEN 31G X 8 MM MISC, USE AS DIRECTED, Disp: 100 each, Rfl: 5   cholecalciferol (VITAMIN D) 1000 units tablet, Take 1,000 Units by mouth daily., Disp: , Rfl:    Continuous Blood Gluc Receiver (FREESTYLE LIBRE 3 READER) DEVI, 1 kit by Does not apply route every 14 (fourteen) days.,  Disp: 1 each, Rfl: 0   Continuous Blood Gluc Sensor (FREESTYLE LIBRE 3 SENSOR) MISC, Place 1 sensor on the skin every 14 days. Use to check glucose continuously, Disp: 2 each, Rfl: 3   digoxin (LANOXIN) 0.125 MG tablet, Take 1 tablet (125 mcg total) by mouth daily., Disp: 90 tablet, Rfl: 1   FARXIGA 10 MG TABS tablet, TAKE 1 TABLET (10 MG TOTAL) BY MOUTH EVERY MORNING., Disp: 90 tablet, Rfl: 0   ferrous sulfate 325 (65 FE) MG tablet, Take 325 mg by mouth daily., Disp: , Rfl: 3   insulin degludec (TRESIBA FLEXTOUCH) 100 UNIT/ML FlexTouch Pen, Inject 44 Units into the skin at bedtime., Disp: 3 mL, Rfl: 11   insulin lispro (HUMALOG KWIKPEN) 100 UNIT/ML KwikPen, INJECT 10 UNITS UNDER THE SKIN BEFORE EACH MEAL, Disp: 15 mL, Rfl: 3   KLOR-CON M20 20 MEQ tablet, TAKE 1 TABLET BY MOUTH THREE TIMES A DAY, Disp: 270 tablet, Rfl: 1   lisinopril (ZESTRIL) 40 MG tablet, TAKE 1/2 TABLET BY MOUTH EVERY DAY, Disp: 45 tablet, Rfl: 7   loperamide (IMODIUM) 2 MG capsule, Take 4 mg by mouth as needed for diarrhea or loose stools., Disp: , Rfl:    metoprolol succinate (TOPROL-XL) 25 MG 24 hr tablet, TAKE 1 TABLET (25 MG TOTAL) BY MOUTH DAILY., Disp: 90 tablet, Rfl: 0   niacin (VITAMIN B3) 500 MG ER tablet, TAKE 1 TABLET BY MOUTH AT BEDTIME AFTER A LOW-FAT SNACK, Disp: 90 tablet, Rfl: 1   omeprazole (PRILOSEC) 20 MG capsule, Take 20 mg by mouth daily. Before breakfast, Disp: , Rfl: 12   pravastatin (PRAVACHOL) 40 MG tablet, TAKE 1 TABLET BY MOUTH EVERY DAY AT BEDTIME AS DIRECTED, Disp: 90 tablet, Rfl: 1   Semaglutide,0.25 or 0.5MG /DOS, (OZEMPIC, 0.25 OR 0.5 MG/DOSE,) 2 MG/3ML SOPN, INJECT 0.5 MG AS DIRECTED ONE TIME PER WEEK, Disp: 2 mL, Rfl: 3   Review of Systems Review of Systems  Constitutional:  Negative for chills and fever.  Eyes:  Negative for blurred vision and double vision.  Respiratory:  Negative for shortness of breath.   Cardiovascular:  Negative for chest pain, palpitations and leg swelling.   Gastrointestinal:  Negative for abdominal pain, constipation, diarrhea, nausea and vomiting.  Neurological:  Positive for dizziness. Negative for weakness and headaches.       Objective:    Vitals BP 118/60 (BP Location: Right Arm, Patient Position: Sitting, Cuff Size: Large)   Pulse 78   Temp (!) 97.5 F (36.4 C)   Resp 17   Ht 5\' 11"  (1.803 m)   Wt 279 lb 6 oz (126.7 kg)  SpO2 96%   BMI 38.96 kg/m    Physical Examination Physical Exam Constitutional:      Appearance: Normal appearance. He is not ill-appearing.  Cardiovascular:     Rate and Rhythm: Normal rate and regular rhythm.     Pulses: Normal pulses.     Heart sounds: No murmur heard.    No friction rub. No gallop.  Pulmonary:     Effort: Pulmonary effort is normal. No respiratory distress.     Breath sounds: No wheezing, rhonchi or rales.  Abdominal:     General: Abdomen is flat. Bowel sounds are normal. There is no distension.     Palpations: Abdomen is soft.     Tenderness: There is no abdominal tenderness.  Musculoskeletal:     Right lower leg: No edema.     Left lower leg: No edema.  Skin:    General: Skin is warm and dry.     Findings: No rash.  Neurological:     Mental Status: He is alert.        Assessment & Plan:   Orthostatic hypotension He is orthostatic on his orthostatic check today where his HR went from 74 to 93 with lying to sitting.  I think he was working in the heat and he is taking lasix 40mg  BID and he got over heated and had some dehydration that caused him to have syncope.  I check his EKG today and this shows sinus rhythm with a LBBB and no evidence of A. Fib today.  We are going to cut his lasix back to lasix 40mg  only once per day.    No follow-ups on file.   Crist Fat, MD

## 2022-09-04 ENCOUNTER — Other Ambulatory Visit: Payer: Self-pay | Admitting: Internal Medicine

## 2022-09-08 ENCOUNTER — Encounter: Payer: Self-pay | Admitting: Internal Medicine

## 2022-09-22 ENCOUNTER — Other Ambulatory Visit: Payer: Self-pay | Admitting: Internal Medicine

## 2022-09-30 ENCOUNTER — Ambulatory Visit: Payer: 59 | Admitting: Internal Medicine

## 2022-10-05 ENCOUNTER — Ambulatory Visit: Payer: 59 | Admitting: Internal Medicine

## 2022-10-05 ENCOUNTER — Encounter: Payer: Self-pay | Admitting: Internal Medicine

## 2022-10-05 VITALS — BP 132/80 | HR 71 | Temp 98.0°F | Resp 16 | Ht 71.0 in | Wt 273.6 lb

## 2022-10-05 DIAGNOSIS — I10 Essential (primary) hypertension: Secondary | ICD-10-CM

## 2022-10-05 DIAGNOSIS — N1832 Chronic kidney disease, stage 3b: Secondary | ICD-10-CM | POA: Diagnosis not present

## 2022-10-05 DIAGNOSIS — E1121 Type 2 diabetes mellitus with diabetic nephropathy: Secondary | ICD-10-CM

## 2022-10-05 DIAGNOSIS — I951 Orthostatic hypotension: Secondary | ICD-10-CM

## 2022-10-05 NOTE — Assessment & Plan Note (Signed)
His BP is doing well.  We will continue to follow.

## 2022-10-05 NOTE — Assessment & Plan Note (Signed)
His BP has been stable and he is not having any symptoms.  We cut back his lasix and he is drinking fluids well.  There is no signs of volume overload on today's exam.

## 2022-10-05 NOTE — Progress Notes (Signed)
Office Visit  Subjective   Patient ID: Ceth Mullady   DOB: 04/26/1945   Age: 77 y.o.   MRN: [id number]   Chief Complaint Chief Complaint  Patient presents with   Follow-up     History of Present Illness Mr. Bluemel is a 77 yo male who returns today for followup of his orthostatic hypotension.  I saw him a month ago where he was seen in the ER on 08/30/2022 due to syncope.  He states he was weed eating in his yard on the above date when he began feel lightheaded.  He had a syncopal episode at that time and states he blacked out and thinks he was out for about 10 min.  He brought in to the ER and told them he has had problems in the past with lightheadedness/dizziness.  He also hit his left elbow where a xray showed a linear nondisplaced fracture of the coronoid process of the ulna.  They did an EKG at ER and this showed NSR with a LBBB with a HR of 90.  His BP was noted to be 99/55 in the ER at one point.  They discharged home and he has seen orthopedics for his elbow as an outpatient and was told this was a crack and not a fracture and told him it will heal on it's own.  They are starting him on physical therapy.  In regards to his syncope, he states his only prodromal symptom was this dizziness and then he passed out.  He denied any chest pain, palpitations, SOB, diaphoresis, nausea, vomiting, blurred vision or other problems.  He states he will get occasional dizziness when he gets up from sitting to standing where he has to stand there for a bit to equalize.  We did do orthostatics on him and he was orthostatic and I felt his passing out episode was related to him working in the heat.  He was taking lasix 40mg  BID and he got over heated and had some dehydration that caused him to have syncope.  I checked an EKG and this showed sinus rhythm with a LBBB and no evidence of A. Fib today.  We did cut his lasix back to lasix 40mg  only once per day.  The patient denies any further dizziness or  syncope.  The patient is a 77 year old Caucasian/White male who returns for a follow-up visit for his T2 diabetes.  On his last visit, his A1c was not controlled and I asked him to restart ozempic.  He had problems with diarrhea in the past on ozempic but we had him start at 0.25mg  once a week and he had no problems.  This past week he for some reason went to the 0.5mg  and began having diarrhea.  This past year, I started him on farxiga for diabetic nephropathy.  He has had symptoms of hypoglycemia in the past where he was admitted to [facility] in 01/2021 for presyncope.  This was due to him taking his insulin and not eating and he was also having diarrhea at that time.  He remains on tresiba 44 units daily and humalog 15 units before meals which is usually twice per day as well as farxiga 10mg  daily and ozempic 0.5mg  subcut weekly.  He is not walking as much as they would like.  He specifically denies unexplained abdominal pain, nausea or vomiting, or documented hypoglycemia where he has had no more hypoglycemia since his hospitalization in 01/2021.  We did obtain a freestyle [place]  but he has not been using it lately.  His last HgbA1c was done 3 months ago and was 7%. He has no diabetic retinopathy or neuropathy but he does have probable diabetic and hypertensive nephropathy with Stage IIIb chronic kidney disease.  His last diabetic eye exam was in 04/2022 at [facility] but I do not have that report.     The patient has Stage IIIb CKD which is probably due to his T2 diabetes and his hypertension.  His creatinine has ranged 1.4-1.6 with a GFR 33-45 where he has been noted by our practice to have CKD since 2011.  He is currently on farxiga and lisinopril.  He has been avoiding NSAIDS.   The patient is a 77 year old Caucasian/White male who presents for a follow-up evaluation of hypertension.  Since his last visit, there has been no problems.  The patient has been checking his blood pressure at home.  He states his  systolic BP runs 616-073'X.   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  He uses a stationary bike for 30-40 min.   The patient's current medications include: lasix 40 mg daily, lisinopril 20 mg daily, and Toprol XL 25 mg daily. The patient has been tolerating his medications well. The patient denies any headache, visual changes, dizziness, lightheadness, chest pain, shortness of breath, and weakness/numbness.  He reports there have been no other symptoms noted.       Past Medical History Past Medical History:  Diagnosis Date   Atrial fibrillation (HCC)    takes Dilt but not apparently AC   BPH (benign prostatic hyperplasia)    Cardiomyopathy (HCC)    Chronic diastolic (congestive) heart failure (HCC)    Chronic venous insufficiency    CKD (chronic kidney disease), stage III (HCC)    Diabetic nephropathy associated with type 2 diabetes mellitus (HCC)    Dyslipidemia    GERD (gastroesophageal reflux disease)    Gout    History of gastric ulcer    Hyperlipidemia    Left bundle branch block (LBBB)    Paroxysmal atrial tachycardia (HCC)    Thrombocytopenia (HCC)      Allergies Allergies  Allergen Reactions   Penicillins Hives and Rash    Has patient had a PCN reaction causing immediate rash, facial/tongue/throat swelling, SOB or lightheadedness with hypotension: No  Has patient had a PCN reaction causing severe rash involving mucus membranes or skin necrosis: No  Has patient had a PCN reaction that required hospitalization: No  Has patient had a PCN reaction occurring within the last 10 years: No  If all of the above answers are "NO", then may proceed with Cephalosporin use.   Aspirin      Medications  Current Outpatient Medications:    ACCU-CHEK GUIDE test strip, USE AS DIRECTED (INSULIN USE), Disp: 100 strip, Rfl: 3   Accu-Chek Softclix Lancets lancets, USE AS DIRECTED, Disp: 100 each, Rfl: 5   allopurinol (ZYLOPRIM)  100 MG tablet, TAKE 2 TABLETS BY MOUTH EVERY DAY, Disp: 180 tablet, Rfl: 1   B-D ULTRAFINE III SHORT PEN 31G X 8 MM MISC, USE AS DIRECTED, Disp: 100 each, Rfl: 5   cholecalciferol (VITAMIN D) 1000 units tablet, Take 1,000 Units by mouth daily., Disp: , Rfl:    Continuous Blood Gluc Receiver (FREESTYLE LIBRE 3 READER) DEVI, 1 kit by Does not apply route every 14 (fourteen) days., Disp: 1 each, Rfl: 0  Continuous Blood Gluc Sensor (FREESTYLE LIBRE 3 SENSOR) MISC, Place 1 sensor on the skin every 14 days. Use to check glucose continuously, Disp: 2 each, Rfl: 3   digoxin (LANOXIN) 0.125 MG tablet, Take 1 tablet (125 mcg total) by mouth daily., Disp: 90 tablet, Rfl: 1   FARXIGA 10 MG TABS tablet, TAKE 1 TABLET (10 MG TOTAL) BY MOUTH EVERY MORNING., Disp: 90 tablet, Rfl: 0   ferrous sulfate 325 (65 FE) MG tablet, Take 325 mg by mouth daily., Disp: , Rfl: 3   furosemide (LASIX) 40 MG tablet, TAKE 1 TABLET BY MOUTH TWICE A DAY, Disp: 60 tablet, Rfl: 1   insulin degludec (TRESIBA FLEXTOUCH) 100 UNIT/ML FlexTouch Pen, Inject 44 Units into the skin at bedtime., Disp: 3 mL, Rfl: 11   insulin lispro (HUMALOG KWIKPEN) 100 UNIT/ML KwikPen, INJECT 10 UNITS UNDER THE SKIN BEFORE EACH MEAL, Disp: 15 mL, Rfl: 3   KLOR-CON M20 20 MEQ tablet, TAKE 1 TABLET BY MOUTH THREE TIMES A DAY, Disp: 270 tablet, Rfl: 1   lisinopril (ZESTRIL) 40 MG tablet, TAKE 1/2 TABLET BY MOUTH EVERY DAY, Disp: 45 tablet, Rfl: 7   loperamide (IMODIUM) 2 MG capsule, Take 4 mg by mouth as needed for diarrhea or loose stools., Disp: , Rfl:    metoprolol succinate (TOPROL-XL) 25 MG 24 hr tablet, TAKE 1 TABLET (25 MG TOTAL) BY MOUTH DAILY., Disp: 90 tablet, Rfl: 0   niacin (VITAMIN B3) 500 MG ER tablet, TAKE 1 TABLET BY MOUTH AT BEDTIME AFTER A LOW-FAT SNACK, Disp: 90 tablet, Rfl: 1   omeprazole (PRILOSEC) 20 MG capsule, Take 20 mg by mouth daily. Before breakfast, Disp: , Rfl: 12   pravastatin (PRAVACHOL) 40 MG tablet, TAKE 1 TABLET BY MOUTH EVERY  DAY AT BEDTIME AS DIRECTED, Disp: 90 tablet, Rfl: 1   Semaglutide,0.25 or 0.5MG /DOS, (OZEMPIC, 0.25 OR 0.5 MG/DOSE,) 2 MG/3ML SOPN, INJECT 0.5 MG AS DIRECTED ONE TIME PER WEEK, Disp: 2 mL, Rfl: 3   Review of Systems Review of Systems  Constitutional:  Negative for chills and fever.  Eyes:  Negative for blurred vision and double vision.  Respiratory:  Negative for cough and shortness of breath.   Cardiovascular:  Negative for chest pain, palpitations and leg swelling.  Gastrointestinal:  Negative for abdominal pain, constipation, diarrhea, nausea and vomiting.  Genitourinary:  Negative for frequency.  Musculoskeletal:  Negative for myalgias.  Skin:  Negative for itching and rash.  Neurological:  Negative for dizziness, weakness and headaches.  Endo/Heme/Allergies:  Negative for polydipsia.       Objective:    Vitals BP 132/80   Pulse 71   Temp 98 F (36.7 C)   Resp 16   Ht 5\' 11"  (1.803 m)   Wt 273 lb 9.6 oz (124.1 kg)   SpO2 97%   BMI 38.16 kg/m    Physical Examination Physical Exam Constitutional:      Appearance: Normal appearance. He is not ill-appearing.  Cardiovascular:     Rate and Rhythm: Normal rate and regular rhythm.     Pulses: Normal pulses.     Heart sounds: No murmur heard.    No friction rub. No gallop.  Pulmonary:     Effort: Pulmonary effort is normal. No respiratory distress.     Breath sounds: No wheezing, rhonchi or rales.  Abdominal:     General: Abdomen is flat. Bowel sounds are normal. There is no distension.     Palpations: Abdomen is soft.     Tenderness:  There is no abdominal tenderness.  Musculoskeletal:     Right lower leg: No edema.     Left lower leg: No edema.  Skin:    General: Skin is warm and dry.     Findings: No rash.  Neurological:     General: No focal deficit present.     Mental Status: He is alert and oriented to person, place, and time.  Psychiatric:        Mood and Affect: Mood normal.        Behavior: Behavior  normal.        Assessment & Plan:   Orthostatic hypotension His BP has been stable and he is not having any symptoms.  We cut back his lasix and he is drinking fluids well.  There is no signs of volume overload on today's exam.  Essential hypertension His BP is doing well.  We will continue to follow.  Diabetic nephropathy associated with type 2 diabetes mellitus (HCC) We will check his HgBa1c today.  I want him to continue his meds at this time.  CKD (chronic kidney disease), stage III (HCC) He is on an ACE-I and farxiga.  We will check his CMP and he is to avoid all NSAIDS.    Return in about 3 months (around 01/05/2023).   Crist Fat, MD

## 2022-10-05 NOTE — Assessment & Plan Note (Signed)
He is on an ACE-I and farxiga.  We will check his CMP and he is to avoid all NSAIDS.

## 2022-10-05 NOTE — Assessment & Plan Note (Signed)
We will check his HgBa1c today.  I want him to continue his meds at this time.

## 2022-10-06 LAB — CMP14 + ANION GAP
ALT: 22 [IU]/L (ref 0–44)
AST: 21 [IU]/L (ref 0–40)
Albumin: 4 g/dL (ref 3.8–4.8)
Alkaline Phosphatase: 94 [IU]/L (ref 44–121)
Anion Gap: 14 mmol/L (ref 10.0–18.0)
BUN/Creatinine Ratio: 15 (ref 10–24)
BUN: 26 mg/dL (ref 8–27)
Bilirubin Total: 0.5 mg/dL (ref 0.0–1.2)
CO2: 24 mmol/L (ref 20–29)
Calcium: 8.7 mg/dL (ref 8.6–10.2)
Chloride: 102 mmol/L (ref 96–106)
Creatinine, Ser: 1.68 mg/dL — ABNORMAL HIGH (ref 0.76–1.27)
Globulin, Total: 2.8 g/dL (ref 1.5–4.5)
Glucose: 118 mg/dL — ABNORMAL HIGH (ref 70–99)
Potassium: 5.1 mmol/L (ref 3.5–5.2)
Sodium: 140 mmol/L (ref 134–144)
Total Protein: 6.8 g/dL (ref 6.0–8.5)
eGFR: 41 mL/min/{1.73_m2} — ABNORMAL LOW (ref >59)

## 2022-10-06 LAB — HEMOGLOBIN A1C
Est. average glucose Bld gHb Est-mCnc: 157 mg/dL
Hgb A1c MFr Bld: 7.1 % — ABNORMAL HIGH (ref 4.8–5.6)

## 2022-10-10 ENCOUNTER — Other Ambulatory Visit: Payer: Self-pay | Admitting: Internal Medicine

## 2022-10-14 DIAGNOSIS — S52042A Displaced fracture of coronoid process of left ulna, initial encounter for closed fracture: Secondary | ICD-10-CM | POA: Diagnosis not present

## 2022-10-26 NOTE — Progress Notes (Signed)
His A1c is almost controlled. He can either go up on his ozempic to 1mg  subcut weekly or increase his tresiba to 50 units daily.  Patient is aware of lab results and is going to go up on the tresiba at 50 units daily.

## 2022-11-02 ENCOUNTER — Other Ambulatory Visit: Payer: Self-pay | Admitting: Internal Medicine

## 2022-11-13 ENCOUNTER — Other Ambulatory Visit: Payer: Self-pay | Admitting: Internal Medicine

## 2022-11-25 DIAGNOSIS — K649 Unspecified hemorrhoids: Secondary | ICD-10-CM | POA: Diagnosis not present

## 2022-11-25 DIAGNOSIS — E119 Type 2 diabetes mellitus without complications: Secondary | ICD-10-CM | POA: Diagnosis not present

## 2022-11-25 DIAGNOSIS — K219 Gastro-esophageal reflux disease without esophagitis: Secondary | ICD-10-CM | POA: Diagnosis not present

## 2022-12-11 ENCOUNTER — Other Ambulatory Visit: Payer: Self-pay | Admitting: Internal Medicine

## 2022-12-18 ENCOUNTER — Other Ambulatory Visit: Payer: Self-pay | Admitting: Internal Medicine

## 2023-01-06 ENCOUNTER — Ambulatory Visit: Payer: 59 | Admitting: Internal Medicine

## 2023-01-06 ENCOUNTER — Encounter: Payer: Self-pay | Admitting: Internal Medicine

## 2023-01-06 VITALS — BP 126/78 | HR 65 | Temp 98.6°F | Resp 18 | Ht 71.0 in | Wt 285.1 lb

## 2023-01-06 DIAGNOSIS — N1832 Chronic kidney disease, stage 3b: Secondary | ICD-10-CM

## 2023-01-06 DIAGNOSIS — I10 Essential (primary) hypertension: Secondary | ICD-10-CM | POA: Diagnosis not present

## 2023-01-06 DIAGNOSIS — E1121 Type 2 diabetes mellitus with diabetic nephropathy: Secondary | ICD-10-CM

## 2023-01-06 NOTE — Assessment & Plan Note (Signed)
 He remains on farxiga and lisinopril.  He is to continue to avoid NSAIDS.

## 2023-01-06 NOTE — Progress Notes (Signed)
 Office Visit  Subjective   Patient ID: Stacy Deshler   DOB: [birth year]   Age: 78 y.o.   MRN: [id number]   Chief Complaint Chief Complaint  Patient presents with   Follow-up    3 mos. Follow up on DM     History of Present Illness The patient is a 78 year old Caucasian/White male who presents for a follow-up evaluation of hypertension.  This past year, the patient had problems with orthostatic hypotension where he was seen in the ER on 08/30/2022 due to syncope.  This may have been related to his diuretic use and heat where he was working in the yard.  I did stop his lasix  and he denies any more problems with dizziness/orthostasis.  Since his last visit, there has been no problems.  The patient has been checking his blood pressure at home.  He states his systolic BP runs 859'd.   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  He uses a stationary bike for 30-40 min.   The patient's current medications include: lisinopril  20 mg daily and Toprol  XL 25 mg daily. The patient has been tolerating his medications well. The patient denies any headache, visual changes, dizziness, lightheadness, chest pain, shortness of breath, and weakness/numbness.  He reports there have been no other symptoms noted.   The patient is a 78 year old Caucasian/White male who returns for a follow-up visit for his T2 diabetes.  On his last visit, his A1c was not at goal and we increased his tresiba  from 44 Units to 50 Units daily.  He had problems with diarrhea in the past but he has not had any recurrent diarrhea on his current ozempic  dose.  This past year, I started him on farxiga  for diabetic nephropathy.  He has had symptoms of hypoglycemia in the past where he was admitted to [facility] in 01/2021 for presyncope.  This was due to him taking his insulin  and not eating and he was also having diarrhea at that time.  He remains on tresiba  50 units daily and humalog  15 units before meals  which is usually twice per day, farxiga  10mg  daily and ozempic  0.5mg  subcut weekly.  He is not walking as much as they would like.  He specifically denies unexplained abdominal pain, nausea or vomiting, or documented hypoglycemia where he has had no more hypoglycemia since his hospitalization in 01/2021.  We did obtain a freestyle libre but he has not been using it lately.  He checks his FSBS daily and his sugars run 110-130's.   His last HgbA1c was done 3 months ago and was 7.1%. He has no diabetic retinopathy or neuropathy but he does have probable diabetic and hypertensive nephropathy with Stage IIIb chronic kidney disease.  His last dilated diabetic eye exam was done on 06/01/2022 by Dr. Lemond at [facility] and there was no evidence of diabetic retinopathy.  He did another dilated eye exam on 10/06/2022 and this also showed no evidence of diabetic retinopathy.   The patient has Stage IIIb CKD which is probably due to his T2 diabetes and his hypertension.  His creatinine has ranged 1.4-1.6 with a GFR 33-45 where he has been noted by our practice to have CKD since 2011.  He is currently on farxiga  and lisinopril .  He has been avoiding NSAIDS.       Past Medical History Past Medical History:  Diagnosis Date   Atrial fibrillation (  HCC)    takes Dilt but not apparently [facility]   BPH (benign prostatic hyperplasia)    Cardiomyopathy (HCC)    Chronic diastolic (congestive) heart failure (HCC)    Chronic venous insufficiency    CKD (chronic kidney disease), stage III (HCC)    Diabetic nephropathy associated with type 2 diabetes mellitus (HCC)    Dyslipidemia    GERD (gastroesophageal reflux disease)    Gout    History of gastric ulcer    Hyperlipidemia    Left bundle branch block (LBBB)    Paroxysmal atrial tachycardia (HCC)    Thrombocytopenia (HCC)      Allergies Allergies  Allergen Reactions   Penicillins Hives and Rash    Has patient had a PCN reaction causing immediate rash,  facial/tongue/throat swelling, SOB or lightheadedness with hypotension: No  Has patient had a PCN reaction causing severe rash involving mucus membranes or skin necrosis: No  Has patient had a PCN reaction that required hospitalization: No  Has patient had a PCN reaction occurring within the last 10 years: No  If all of the above answers are NO, then may proceed with Cephalosporin use.   Aspirin      Medications  Current Outpatient Medications:    ACCU-CHEK GUIDE test strip, USE AS DIRECTED (INSULIN  USE), Disp: 100 strip, Rfl: 3   Accu-Chek Softclix Lancets lancets, USE AS DIRECTED, Disp: 100 each, Rfl: 5   allopurinol (ZYLOPRIM) 100 MG tablet, TAKE 2 TABLETS BY MOUTH EVERY DAY, Disp: 180 tablet, Rfl: 1   B-D ULTRAFINE III SHORT PEN 31G X 8 MM MISC, USE AS DIRECTED, Disp: 100 each, Rfl: 5   cholecalciferol (VITAMIN D) 1000 units tablet, Take 1,000 Units by mouth daily., Disp: , Rfl:    Continuous Blood Gluc Receiver (FREESTYLE LIBRE 3 READER) DEVI, 1 kit by Does not apply route every 14 (fourteen) days., Disp: 1 each, Rfl: 0   Continuous Blood Gluc Sensor (FREESTYLE LIBRE 3 SENSOR) MISC, Place 1 sensor on the skin every 14 days. Use to check glucose continuously, Disp: 2 each, Rfl: 3   digoxin  (LANOXIN ) 0.125 MG tablet, TAKE 1 TABLET BY MOUTH EVERY DAY, Disp: 90 tablet, Rfl: 1   FARXIGA  10 MG TABS tablet, TAKE 1 TABLET (10 MG TOTAL) BY MOUTH EVERY MORNING., Disp: 90 tablet, Rfl: 0   ferrous sulfate 325 (65 FE) MG tablet, Take 325 mg by mouth daily., Disp: , Rfl: 3   insulin  degludec (TRESIBA  FLEXTOUCH) 100 UNIT/ML FlexTouch Pen, Inject 44 Units into the skin at bedtime., Disp: 3 mL, Rfl: 11   insulin  lispro (HUMALOG  KWIKPEN) 100 UNIT/ML KwikPen, INJECT 10 UNITS UNDER THE SKIN BEFORE EACH MEAL, Disp: 3 mL, Rfl: 3   KLOR-CON M20 20 MEQ tablet, TAKE 1 TABLET BY MOUTH THREE TIMES A DAY, Disp: 270 tablet, Rfl: 1   lisinopril  (ZESTRIL ) 40 MG tablet, TAKE 1/2 TABLET BY MOUTH EVERY DAY, Disp:  45 tablet, Rfl: 7   loperamide (IMODIUM) 2 MG capsule, Take 4 mg by mouth as needed for diarrhea or loose stools., Disp: , Rfl:    metoprolol  succinate (TOPROL -XL) 25 MG 24 hr tablet, TAKE 1 TABLET (25 MG TOTAL) BY MOUTH DAILY., Disp: 90 tablet, Rfl: 0   niacin (VITAMIN B3) 500 MG ER tablet, TAKE 1 TABLET BY MOUTH AT BEDTIME AFTER A LOW-FAT SNACK, Disp: 90 tablet, Rfl: 1   omeprazole  (PRILOSEC) 20 MG capsule, Take 20 mg by mouth daily. Before breakfast, Disp: , Rfl: 12   pravastatin (PRAVACHOL) 40 MG  tablet, TAKE 1 TABLET BY MOUTH EVERY DAY AT BEDTIME AS DIRECTED, Disp: 90 tablet, Rfl: 1   Semaglutide ,0.25 or 0.5MG /DOS, (OZEMPIC , 0.25 OR 0.5 MG/DOSE,) 2 MG/3ML SOPN, INJECT 0.5 MG AS DIRECTED ONE TIME PER WEEK, Disp: 2 mL, Rfl: 3   Review of Systems Review of Systems  Constitutional:  Negative for chills and fever.  Eyes:  Negative for blurred vision and double vision.  Respiratory:  Negative for cough and shortness of breath.   Cardiovascular:  Positive for leg swelling. Negative for chest pain and palpitations.  Gastrointestinal:  Negative for abdominal pain, constipation, diarrhea, heartburn, nausea and vomiting.  Genitourinary:  Negative for frequency.  Musculoskeletal:  Negative for myalgias.  Skin:  Negative for itching and rash.  Neurological:  Negative for dizziness, weakness and headaches.  Endo/Heme/Allergies:  Negative for polydipsia.       Objective:    Vitals BP 126/78 (BP Location: Left Arm, Patient Position: Sitting, Cuff Size: Large)   Pulse 65   Temp 98.6 F (37 C) (Oral)   Resp 18   Ht 5' 11 (1.803 m)   Wt 285 lb 2 oz (129.3 kg)   SpO2 99%   BMI 39.77 kg/m    Physical Examination Physical Exam Constitutional:      Appearance: Normal appearance. He is not ill-appearing.  Cardiovascular:     Rate and Rhythm: Normal rate and regular rhythm.     Pulses: Normal pulses.     Heart sounds: No murmur heard.    No friction rub. No gallop.  Pulmonary:     Effort:  Pulmonary effort is normal. No respiratory distress.     Breath sounds: No wheezing, rhonchi or rales.  Abdominal:     General: Abdomen is flat. Bowel sounds are normal. There is no distension.     Palpations: Abdomen is soft.     Tenderness: There is no abdominal tenderness.  Musculoskeletal:     Right lower leg: No edema.     Left lower leg: No edema.  Skin:    General: Skin is warm and dry.     Findings: No rash.  Neurological:     General: No focal deficit present.     Mental Status: He is alert and oriented to person, place, and time.  Psychiatric:        Mood and Affect: Mood normal.        Behavior: Behavior normal.        Assessment & Plan:   Essential hypertension He is no longer having problems with orthostatic hypotension.  We stopped his lasix  and his BP is doing well and is controlled.  Diabetic nephropathy associated with type 2 diabetes mellitus (HCC) I went up on his dose of tresiba  and we will check his HgBa1c today.  CKD (chronic kidney disease), stage III (HCC) He remains on farxiga  and lisinopril .  He is to continue to avoid NSAIDS.    Return in about 3 months (around 04/06/2023) for annual.   Rook Maue Van Eyk, MD

## 2023-01-06 NOTE — Assessment & Plan Note (Signed)
 He is no longer having problems with orthostatic hypotension.  We stopped his lasix and his BP is doing well and is controlled.

## 2023-01-06 NOTE — Assessment & Plan Note (Signed)
 I went up on his dose of tresiba and we will check his HgBa1c today.

## 2023-01-07 LAB — BASIC METABOLIC PANEL
BUN/Creatinine Ratio: 17 (ref 10–24)
BUN: 24 mg/dL (ref 8–27)
CO2: 23 mmol/L (ref 20–29)
Calcium: 8.7 mg/dL (ref 8.6–10.2)
Chloride: 105 mmol/L (ref 96–106)
Creatinine, Ser: 1.45 mg/dL — ABNORMAL HIGH (ref 0.76–1.27)
Glucose: 109 mg/dL — ABNORMAL HIGH (ref 70–99)
Potassium: 5.3 mmol/L — ABNORMAL HIGH (ref 3.5–5.2)
Sodium: 141 mmol/L (ref 134–144)
eGFR: 49 mL/min/{1.73_m2} — ABNORMAL LOW (ref >59)

## 2023-01-07 LAB — HEMOGLOBIN A1C
Est. average glucose Bld gHb Est-mCnc: 148 mg/dL
Hgb A1c MFr Bld: 6.8 % — ABNORMAL HIGH (ref 4.8–5.6)

## 2023-01-13 DIAGNOSIS — I4891 Unspecified atrial fibrillation: Secondary | ICD-10-CM | POA: Diagnosis not present

## 2023-01-13 DIAGNOSIS — N189 Chronic kidney disease, unspecified: Secondary | ICD-10-CM | POA: Diagnosis not present

## 2023-01-13 DIAGNOSIS — I509 Heart failure, unspecified: Secondary | ICD-10-CM | POA: Diagnosis not present

## 2023-01-13 DIAGNOSIS — Z860100 Personal history of colon polyps, unspecified: Secondary | ICD-10-CM | POA: Diagnosis not present

## 2023-01-13 DIAGNOSIS — I130 Hypertensive heart and chronic kidney disease with heart failure and stage 1 through stage 4 chronic kidney disease, or unspecified chronic kidney disease: Secondary | ICD-10-CM | POA: Diagnosis not present

## 2023-01-13 DIAGNOSIS — M109 Gout, unspecified: Secondary | ICD-10-CM | POA: Diagnosis not present

## 2023-01-13 DIAGNOSIS — K648 Other hemorrhoids: Secondary | ICD-10-CM | POA: Diagnosis not present

## 2023-01-13 DIAGNOSIS — E1122 Type 2 diabetes mellitus with diabetic chronic kidney disease: Secondary | ICD-10-CM | POA: Diagnosis not present

## 2023-01-13 DIAGNOSIS — Z09 Encounter for follow-up examination after completed treatment for conditions other than malignant neoplasm: Secondary | ICD-10-CM | POA: Diagnosis not present

## 2023-01-18 NOTE — Progress Notes (Signed)
 His labs look good and his diabetes is controlled.  Patient aware

## 2023-01-21 ENCOUNTER — Other Ambulatory Visit: Payer: Self-pay | Admitting: Internal Medicine

## 2023-02-04 ENCOUNTER — Other Ambulatory Visit: Payer: Self-pay | Admitting: Internal Medicine

## 2023-03-03 ENCOUNTER — Other Ambulatory Visit: Payer: Self-pay | Admitting: Internal Medicine

## 2023-03-03 ENCOUNTER — Other Ambulatory Visit: Payer: Self-pay

## 2023-03-03 MED ORDER — BD PEN NEEDLE SHORT U/F 31G X 8 MM MISC: 5 refills | Status: DC

## 2023-03-03 MED ORDER — OZEMPIC (0.25 OR 0.5 MG/DOSE) 2 MG/3ML ~~LOC~~ SOPN: 0.5000 mg | PEN_INJECTOR | SUBCUTANEOUS | 1 refills | Status: DC

## 2023-03-03 NOTE — Progress Notes (Signed)
 Rx refills

## 2023-03-12 ENCOUNTER — Other Ambulatory Visit: Payer: Self-pay | Admitting: Internal Medicine

## 2023-03-19 ENCOUNTER — Other Ambulatory Visit: Payer: Self-pay | Admitting: Internal Medicine

## 2023-03-27 ENCOUNTER — Other Ambulatory Visit: Payer: Self-pay | Admitting: Internal Medicine

## 2023-04-07 ENCOUNTER — Encounter: Payer: 59 | Admitting: Internal Medicine

## 2023-04-12 ENCOUNTER — Other Ambulatory Visit: Payer: Self-pay

## 2023-04-12 MED ORDER — BD PEN NEEDLE SHORT U/F 31G X 8 MM MISC: 5 refills | Status: DC

## 2023-04-12 MED ORDER — BD PEN NEEDLE SHORT U/F 31G X 8 MM MISC: 3 refills | Status: DC

## 2023-04-14 ENCOUNTER — Encounter: Payer: Self-pay | Admitting: Internal Medicine

## 2023-04-14 ENCOUNTER — Other Ambulatory Visit: Payer: Self-pay | Admitting: Internal Medicine

## 2023-04-14 ENCOUNTER — Ambulatory Visit: Admitting: Internal Medicine

## 2023-04-14 VITALS — BP 136/84 | HR 60 | Temp 98.2°F | Resp 17 | Ht 71.0 in | Wt 283.6 lb

## 2023-04-14 DIAGNOSIS — Z6839 Body mass index (BMI) 39.0-39.9, adult: Secondary | ICD-10-CM | POA: Diagnosis not present

## 2023-04-14 DIAGNOSIS — E1121 Type 2 diabetes mellitus with diabetic nephropathy: Secondary | ICD-10-CM | POA: Diagnosis not present

## 2023-04-14 DIAGNOSIS — I872 Venous insufficiency (chronic) (peripheral): Secondary | ICD-10-CM | POA: Diagnosis not present

## 2023-04-14 DIAGNOSIS — E7800 Pure hypercholesterolemia, unspecified: Secondary | ICD-10-CM

## 2023-04-14 DIAGNOSIS — Z0000 Encounter for general adult medical examination without abnormal findings: Secondary | ICD-10-CM

## 2023-04-14 DIAGNOSIS — I447 Left bundle-branch block, unspecified: Secondary | ICD-10-CM

## 2023-04-14 DIAGNOSIS — Z8711 Personal history of peptic ulcer disease: Secondary | ICD-10-CM

## 2023-04-14 DIAGNOSIS — D696 Thrombocytopenia, unspecified: Secondary | ICD-10-CM | POA: Diagnosis not present

## 2023-04-14 DIAGNOSIS — K219 Gastro-esophageal reflux disease without esophagitis: Secondary | ICD-10-CM | POA: Diagnosis not present

## 2023-04-14 DIAGNOSIS — I480 Paroxysmal atrial fibrillation: Secondary | ICD-10-CM | POA: Diagnosis not present

## 2023-04-14 DIAGNOSIS — I5032 Chronic diastolic (congestive) heart failure: Secondary | ICD-10-CM

## 2023-04-14 DIAGNOSIS — N1832 Chronic kidney disease, stage 3b: Secondary | ICD-10-CM

## 2023-04-14 DIAGNOSIS — I10 Essential (primary) hypertension: Secondary | ICD-10-CM

## 2023-04-14 DIAGNOSIS — N400 Enlarged prostate without lower urinary tract symptoms: Secondary | ICD-10-CM

## 2023-04-14 DIAGNOSIS — M1A49X0 Other secondary chronic gout, multiple sites, without tophus (tophi): Secondary | ICD-10-CM | POA: Diagnosis not present

## 2023-04-14 MED ORDER — BD PEN NEEDLE SHORT U/F 31G X 8 MM MISC: 3 refills | Status: DC

## 2023-04-14 MED ORDER — TAMSULOSIN HCL 0.4 MG PO CAPS: 0.4000 mg | ORAL_CAPSULE | Freq: Every day | ORAL | 3 refills | Status: AC

## 2023-04-14 MED ORDER — OMEPRAZOLE 20 MG PO CPDR: 20.0000 mg | DELAYED_RELEASE_CAPSULE | Freq: Every day | ORAL | 3 refills | Status: AC

## 2023-04-14 NOTE — Assessment & Plan Note (Signed)
 His A. Fib seems rate controlled where he is on digoxin and metoprolol.  We will check a digoxin level today.  He remains on ASA.

## 2023-04-14 NOTE — Assessment & Plan Note (Signed)
 The patient will avoid salt and elevate his legs and wear compression hose as needed.

## 2023-04-14 NOTE — Assessment & Plan Note (Signed)
 We will continue on omeprazole at this time.

## 2023-04-14 NOTE — Assessment & Plan Note (Signed)
 He denies any problems with gout at this time.  Continue on allopurinol and we will check a uric acid level at this time.

## 2023-04-14 NOTE — Progress Notes (Signed)
 Preventive Screening-Counseling & Management    Dennis Nunez is a 78 year old Caucasian/White male who presents for his annual health maintenance exam. This patient's past medical history Atrial Fibrillation, Benign Prostatic Hypertrophy, Cardiomyopathy, Chronic kidney disease, Stage III (moderate), Chronic Left Bundle Branch Block, Congestive Heart Failure, Diabetes Mellitus, Type II, Uncontrolled, Diabetes Nephropathy, Gastric Ulcer, GERD, Hyperlipidemia, Hypertension, Benign Essential, Paroxysmal Atrial Tachycardia, and Venous Insufficiency.    His last dilated eye exam was in 10/07/2022 by Dr. Alice Reichert and they are following him for bilateral cataracts but there was no diabetic retinopathy. He has a history of PUD with a questionable duodenal ulcer diagnosed about 25 years ago. He does have reflux where he did see GI in 12/2018 and remains on omeprazole. He had an EGD on 12/2013 and this showed mild gastritis. Dr. Braulio Conte wanted him to continue omeprazole and his iron and followup with regular blood counts every 3 months. He denies any problems with reflux or swallowing. His last colonoscopy was performed on 01/13/2023 and this showed internal hemorrhoids but was otherwise normal.  He wanted to consider repeating his colonoscopy in 7 years.  His previous colonoscopy was done on 10/2017 and this showed internal hemorrhoids but was otherwise normal. His prior colonoscopy was done in 09/2012 which showed internal hemorrhoids but was normal. They want him to repeat this in 2019 since he has had polyps in the past. He denies any melena, BRBPR, or other problems. He states he is exercising regularly where he goes to the [facility] gym 1-2 times per week.   The patient has never smoked.  The patient does have yearly flu vaccines. He states he had pneumovax 23 vaccine in 2014. He had his prevnar 13 vaccine 03/2020. He completed his shingrix vaccine in 08/2021.  I wrote him for the RSV vaccine last year but he  never went and had this done.  He has had 5 COVID-19 vaccines including 3 boosters. He denies any depression, anxiety or memory loss. The patient is on an ASA 81mg  daily.   The patient is a 78 year old Caucasian/White male who presents for a follow-up evaluation of hypertension.  This past year, the patient had problems with orthostatic hypotension where he was seen in the ER on 08/30/2022 due to syncope.  This may have been related to his diuretic use and heat where he was working in the yard.  I did stop his lasix and he denies any more problems with dizziness/orthostasis.  Since his last visit, there has been no problems.  The patient has been checking his blood pressure at home.  He states his systolic BP runs 161-096'E.   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  He uses a stationary bike for 30-40 min.   The patient's current medications include: lisinopril 20 mg daily and Toprol XL 25 mg daily. The patient has been tolerating his medications well. The patient denies any headache, visual changes, dizziness, lightheadness, chest pain, shortness of breath, and weakness/numbness.  He reports there have been no other symptoms noted.   The patient is a 78 year old Caucasian/White male who returns for a follow-up visit for his T2 diabetes.  Since his last visit, he has not had any problems.  This past year, his A1c was not at goal and we increased his tresiba from 44 Units to 50 Units daily.  He had problems with diarrhea in the past but he has  not had any recurrent diarrhea on his current ozempic dose.  This past year, I started him on farxiga for diabetic nephropathy.  He has had symptoms of hypoglycemia in the past where he was admitted to [facility] in 01/2021 for presyncope.  This was due to him taking his insulin and not eating and he was also having diarrhea at that time.  He remains on tresiba 50 units daily and humalog 16 units before meals which is usually  twice per day, farxiga 10mg  daily and ozempic 0.5mg  subcut weekly.  He is not walking as much as they would like.  He specifically denies unexplained abdominal pain, nausea or vomiting, diarrhea, or documented hypoglycemia where he has had no more hypoglycemia since his hospitalization in 01/2021.  We did obtain a freestyle libre but he has not been using it lately.  He checks his FSBS daily and his sugars run 90-130's.   His last HgbA1c was done 3 months ago and was 6.8%. He has no diabetic retinopathy or neuropathy but he does have probable diabetic and hypertensive nephropathy with Stage IIIb chronic kidney disease.  His last dilated eye exam was done by Dr. Alice Reichert at [facility] on 10/06/2022 and this also showed no evidence of diabetic retinopathy.   The patient has Stage IIIb CKD which is probably due to his T2 diabetes and his hypertension.  His creatinine has ranged 1.4-1.6 with a GFR 33-45 where he has been noted by our practice to have CKD since 2011.  He is currently on farxiga and lisinopril.  He has been avoiding NSAIDS.    Collier [place]. [place] returns today for routine followup on his cholesterol. Overall, he states he is doing well and is without any complaints or problems at this time. He specifically denies chest pain, abdominal pain, nausea, diarrhea, fatigue or myalgias. He remains on dietary management as well as the following cholesterol lowering medications Niaspan Extended-Release 500 mg oral tablet extended release 24 hr, fish oil and pravastatin 40 mg qhs. He is fasting in anticipation for labs today.  He did see the [facility] pharmacist in the past who felt his ASCVD score was >20% and she recommended placing him on high intensity statin.       He was discovered to have mild thrombocytopenia on annual blood work in 2021 where we have been monitoring this.  He had some low platelet counts a few years ago but this resolved.  The patient denies any bleeding or bruising today.  His platelet count  on 02/2019 was 138 and repeat platelet count on 05/2019 it was 139.  On his yearly exam in 03/2020, his platelet count was 143.  His platelet count was 143 in 03/2022.    Mr. Chalk also had surgery in 09/2017 where he was discharged after a laparoscopic cholecystectomy at [facility].  He was at church when he began having abdominal pain.  He was taken to the hospital where they did a CT scan of his abdomen and pelvis.  This showed cholelithiasis but no biliary ductal dilatation or CBD stones.  GI was consulted and there was no need for ERCP/MRCP.  General surgery was called and the patient underwent laparoscopic cholecystectomy without problems.    This is a 78 year old Caucasian/White male who has paroxysmal atrial fibrillation which was diagnosed in the 1970's duration and presents today for a status visit. His atrial fibrillation is controlled with therapy as summarized in the medication list and previous notes.  He remains on  digoxin 125 mcg daily and Toprol XL 25mg  daily.  Specifically denied complaints: chest pain, palpitations, TIAs, orthopnea, exertional dyspnea, and syncope. Interval history: no healthcare visits with other providers since last seen in this office. Interval studies: none. Anticoagulation status: He remains on an ASA 81mg  daily. His [place] score is a 3.   He does have a history of chronic venous insuffiency of his lower legs. He has been wearing his TED Hose but this is intermittent and he states as long as he wears them he does not have a problem with swelling. He does elevate his legs which does help.     This patient also has mild chronic diastolic CHF which was diagnosed about 15 years ago. He has not had a recent ECHO. His CHF has been in a compensated state on medications as noted in the medication list. He has no change in baseline symptoms since the last visit. Specifically denied complaints: worsening orthopnea, worsening edema, and worsening exertional dyspnea. Interval history:  no healthcare visits with other providers since last seen in this office. Interval studies: none.  He has not had any recent problems with his history of CHF.     Mr. Engram also has a history of gout.  His last gout flare was around 2016.  He saw a rheuamtologist around 2015 for followup of his gout.  He remains on allopurinol at this time.  His gout usually affects his right arm or right leg or his right big toe.  He remains on allopurinol 100mg  daily.  He believes soft drinks and hot dogs flare his gout.  Today, he is not having any problems.       Are there smokers in your home (other than you)? No  Risk Factors Current exercise habits:  as above   Dietary issues discussed: none   Depression Screen (Note: if answer to either of the following is "Yes", a more complete depression screening is indicated)   Over the past two weeks, have you felt down, depressed or hopeless? No  Over the past two weeks, have you felt little interest or pleasure in doing things? No  Have you lost interest or pleasure in daily life? No  Do you often feel hopeless? No  Do you cry easily over simple problems? No  Activities of Daily Living In your present state of health, do you have any difficulty performing the following activities?:  Driving? No Managing money?  No Feeding yourself? No Getting from bed to chair? No Climbing a flight of stairs? No Preparing food and eating?: No Bathing or showering? No Getting dressed: No Getting to the toilet? No Using the toilet:No Moving around from place to place: No In the past year have you fallen or had a near fall?:Yes- described on previous visits with dizziness   Are you sexually active?  No  Do you have more than one partner?  No  Hearing Difficulties: Yes- wears hearing aids Do you often ask people to speak up or repeat themselves? Yes Do you experience ringing or noises in your ears? No Do you have difficulty understanding soft or whispered  voices? Yes   Do you feel that you have a problem with memory? No  Do you often misplace items? No  Do you feel safe at home?  Yes  Cognitive Testing  Alert? Yes  Normal Appearance?Yes  Oriented to person? Yes  Place? Yes   Time? Yes  Recall of three objects?  Yes  Can perform simple calculations?  Yes  Displays appropriate judgment?Yes  Can read the correct time from a watch face?Yes  His MMSE today was 30/30   Fall Risk Prevention  Any stairs in or around the home? No  If so, are there any without handrails? No  Home free of loose throw rugs in walkways, pet beds, electrical cords, etc? Yes  Adequate lighting in your home to reduce risk of falls? Yes  Use of a cane, walker or w/c? No    Time Up and Go  Was the test performed? Yes .  Length of time to ambulate 10 feet: 8 sec.   Gait slow and steady without use of assistive device    Advanced Directives have been discussed with the patient? Yes   List the Names of Other Physician/Practitioners you currently use: Patient Care Team: Crist Fat, MD as PCP - General (Internal Medicine)    Past Medical History:  Diagnosis Date   Atrial fibrillation [facility])    takes Dilt but not apparently [facility]   BPH (benign prostatic hyperplasia)    Cardiomyopathy (HCC)    Chronic diastolic (congestive) heart failure (HCC)    Chronic venous insufficiency    CKD (chronic kidney disease), stage III (HCC)    Diabetic nephropathy associated with type 2 diabetes mellitus (HCC)    Dyslipidemia    GERD (gastroesophageal reflux disease)    Gout    History of gastric ulcer    Hyperlipidemia    Left bundle branch block (LBBB)    Paroxysmal atrial tachycardia (HCC)    Thrombocytopenia (HCC)     Past Surgical History:  Procedure Laterality Date   CHOLECYSTECTOMY N/A 09/03/2017   Procedure: LAPAROSCOPIC CHOLECYSTECTOMY WITH INTRAOPERATIVE CHOLANGIOGRAM;  Surgeon: Manus Rudd, MD;  Location: [facility] OR;  Service: General;  Laterality: N/A;    HERNIA REPAIR        Current Medications  Current Outpatient Medications  Medication Sig Dispense Refill   ACCU-CHEK GUIDE test strip USE AS DIRECTED (INSULIN USE) 100 strip 3   Accu-Chek Softclix Lancets lancets USE AS DIRECTED 100 each 5   allopurinol (ZYLOPRIM) 100 MG tablet TAKE 2 TABLETS BY MOUTH EVERY DAY 180 tablet 1   B-D ULTRAFINE III SHORT PEN 31G X 8 MM MISC Use 3 times a day with his insulin pen. 150 each 3   cholecalciferol (VITAMIN D) 1000 units tablet Take 1,000 Units by mouth daily.     Continuous Blood Gluc Receiver (FREESTYLE LIBRE 3 READER) DEVI 1 kit by Does not apply route every 14 (fourteen) days. 1 each 0   Continuous Blood Gluc Sensor (FREESTYLE LIBRE 3 SENSOR) MISC Place 1 sensor on the skin every 14 days. Use to check glucose continuously 2 each 3   digoxin (LANOXIN) 0.125 MG tablet TAKE 1 TABLET BY MOUTH EVERY DAY 90 tablet 1   FARXIGA 10 MG TABS tablet TAKE 1 TABLET (10 MG TOTAL) BY MOUTH EVERY MORNING. 90 tablet 0   ferrous sulfate 325 (65 FE) MG tablet Take 325 mg by mouth daily.  3   furosemide (LASIX) 40 MG tablet TAKE 1 TABLET BY MOUTH TWICE A DAY 60 tablet 1   insulin degludec (TRESIBA FLEXTOUCH) 100 UNIT/ML FlexTouch Pen Inject 44 Units into the skin at bedtime. 3 mL 11   insulin lispro (HUMALOG KWIKPEN) 100 UNIT/ML KwikPen INJECT 10 UNITS UNDER THE SKIN BEFORE EACH MEAL 3 mL 3   KLOR-CON M20 20 MEQ tablet TAKE 1 TABLET BY MOUTH THREE TIMES A DAY 270 tablet 1  lisinopril (ZESTRIL) 40 MG tablet TAKE 1/2 TABLET BY MOUTH EVERY DAY 45 tablet 7   loperamide (IMODIUM) 2 MG capsule Take 4 mg by mouth as needed for diarrhea or loose stools.     metoprolol succinate (TOPROL-XL) 25 MG 24 hr tablet TAKE 1 TABLET (25 MG TOTAL) BY MOUTH DAILY. 90 tablet 0   niacin (VITAMIN B3) 500 MG ER tablet TAKE 1 TABLET BY MOUTH AT BEDTIME AFTER A LOW-FAT SNACK 90 tablet 1   omeprazole (PRILOSEC) 20 MG capsule Take 20 mg by mouth daily. Before breakfast  12   pravastatin  (PRAVACHOL) 40 MG tablet TAKE 1 TABLET BY MOUTH EVERY DAY AT BEDTIME AS DIRECTED 90 tablet 1   Semaglutide,0.25 or 0.5MG /DOS, (OZEMPIC, 0.25 OR 0.5 MG/DOSE,) 2 MG/3ML SOPN Inject 0.5 mg into the skin once a week. 0.5 mL 1   No current facility-administered medications for this visit.    Allergies Penicillins and Aspirin   Social History Social History   Tobacco Use   Smoking status: Never   Smokeless tobacco: Never  Substance Use Topics   Alcohol use: Never     Review of Systems Review of Systems  Constitutional:  Negative for chills, fever and malaise/fatigue.  Eyes:  Negative for blurred vision and double vision.  Respiratory:  Negative for cough, hemoptysis, shortness of breath and wheezing.   Cardiovascular:  Negative for chest pain, palpitations and leg swelling.  Gastrointestinal:  Negative for abdominal pain, blood in stool, constipation, diarrhea, heartburn, melena, nausea and vomiting.  Genitourinary:  Negative for frequency and hematuria.  Musculoskeletal:  Negative for myalgias.  Skin:  Negative for itching and rash.  Neurological:  Negative for dizziness, weakness and headaches.  Endo/Heme/Allergies:  Negative for polydipsia.  Psychiatric/Behavioral:  Negative for depression and memory loss. The patient is not nervous/anxious and does not have insomnia.      Physical Exam:      Body mass index is 39.55 kg/m. BP 136/84   Pulse 60   Temp 98.2 F (36.8 C)   Resp 17   Ht 5\' 11"  (1.803 m)   Wt 283 lb 9.6 oz (128.6 kg)   SpO2 98%   BMI 39.55 kg/m   Physical Exam Constitutional:      Appearance: Normal appearance. He is not ill-appearing.  HENT:     Head: Normocephalic and atraumatic.     Right Ear: Tympanic membrane, ear canal and external ear normal.     Left Ear: Tympanic membrane, ear canal and external ear normal.     Nose: Nose normal. No congestion or rhinorrhea.     Mouth/Throat:     Mouth: Mucous membranes are dry.     Pharynx: Oropharynx is  clear. No oropharyngeal exudate or posterior oropharyngeal erythema.  Eyes:     General: No scleral icterus.    Conjunctiva/sclera: Conjunctivae normal.     Pupils: Pupils are equal, round, and reactive to light.  Neck:     Vascular: No carotid bruit.  Cardiovascular:     Rate and Rhythm: Normal rate and regular rhythm.     Pulses: Normal pulses.     Heart sounds: No murmur heard.    No friction rub. No gallop.  Pulmonary:     Effort: Pulmonary effort is normal. No respiratory distress.     Breath sounds: No wheezing, rhonchi or rales.  Abdominal:     General: Abdomen is flat. Bowel sounds are normal. There is no distension.     Palpations: Abdomen is soft.  Tenderness: There is no abdominal tenderness.  Musculoskeletal:     Cervical back: Neck supple. No tenderness.     Right lower leg: No edema.     Left lower leg: No edema.  Lymphadenopathy:     Cervical: No cervical adenopathy.  Skin:    General: Skin is warm and dry.     Findings: No rash.  Neurological:     General: No focal deficit present.     Mental Status: He is alert and oriented to person, place, and time.  Psychiatric:        Mood and Affect: Mood normal.        Behavior: Behavior normal.      Assessment:      Diabetic nephropathy associated with type 2 diabetes mellitus (HCC)  Stage 3b chronic kidney disease (HCC)  Essential hypertension  Hypercholesterolemia  Thrombocytopenia (HCC)  Paroxysmal atrial fibrillation (HCC)  Chronic diastolic CHF (congestive heart failure) (HCC)  Chronic venous insufficiency  Other secondary chronic gout of multiple sites without tophus  Gastroesophageal reflux disease, unspecified whether esophagitis present  Benign prostatic hyperplasia without lower urinary tract symptoms  History of gastric ulcer  LBBB (left bundle branch block)  BMI 39.0-39.9,adult  Morbid obesity (HCC)    Plan:     During the course of the visit the patient was educated and  counseled about appropriate screening and preventive services including:   Pneumococcal vaccine  Influenza vaccine Colorectal cancer screening Advanced directives: discussed, he wants to be FULL CODE  Diet review for nutrition referral? Yes ____  Not Indicated __X__   Patient Instructions (the written plan) was given to the patient.  Paroxysmal atrial fibrillation (HCC) His A. Fib seems rate controlled where he is on digoxin and metoprolol.  We will check a digoxin level today.  He remains on ASA.  LBBB (left bundle branch block) This is chronic and noted.  Essential hypertension His BP is currently controlled and we will continue on his current medications.  Chronic venous insufficiency The patient will avoid salt and elevate his legs and wear compression hose as needed.  Chronic diastolic CHF (congestive heart failure) (HCC) He is not on diuretic at this time and there is no evidence of volume overload.  Gastroesophageal reflux disease We will continue on omeprazole at this time.  Diabetic nephropathy associated with type 2 diabetes mellitus (HCC) We will continue on his current medications.  His diabetic foot exam was normal.  We will check some diabetic labs.  CKD (chronic kidney disease), stage III (HCC) We will continue to control his diabetes, HTN and I want him to lose weight.  He is to avoid NSAIDS.  We will check his urine protein levels.  Continue on ozempic and farxiga.  Benign prostatic hyperplasia He is having some increase urination at night.  I am going to try him on a trial of flomax.  Thrombocytopenia (HCC) We will check his platelet levels at this time.  He is not having any problems with bleeding.  Morbid obesity (HCC) I want him to eat healthy, exercise and lose weight.  Hypercholesterolemia He is to continue on his statins and we will check his FLP at this time.  History of gastric ulcer He has no complaints today and we will continue on his  PPI.  Gout He denies any problems with gout at this time.  Continue on allopurinol and we will check a uric acid level at this time.  BMI 39.0-39.9,adult Plan as above.   Prevention  Health maintenance discussed.  We will obtain some yearly labs.   Medicare Attestation I have personally reviewed: The patient's medical and social history Their use of alcohol, tobacco or illicit drugs Their current medications and supplements The patient's functional ability including ADLs,fall risks, home safety risks, cognitive, and hearing and visual impairment Diet and physical activities Evidence for depression or mood disorders  The patient's weight, height, and BMI have been recorded in the chart.  I have made referrals, counseling, and provided education to the patient based on review of the above and I have provided the patient with a written personalized care plan for preventive services.     Crist Fat, MD   04/14/2023

## 2023-04-14 NOTE — Assessment & Plan Note (Signed)
 This is chronic and noted.

## 2023-04-14 NOTE — Assessment & Plan Note (Signed)
 He has no complaints today and we will continue on his PPI.

## 2023-04-14 NOTE — Assessment & Plan Note (Signed)
 We will continue on his current medications.  His diabetic foot exam was normal.  We will check some diabetic labs.

## 2023-04-14 NOTE — Assessment & Plan Note (Signed)
 Plan as above.

## 2023-04-14 NOTE — Assessment & Plan Note (Signed)
 We will continue to control his diabetes, HTN and I want him to lose weight.  He is to avoid NSAIDS.  We will check his urine protein levels.  Continue on ozempic and farxiga.

## 2023-04-14 NOTE — Assessment & Plan Note (Signed)
 He is having some increase urination at night.  I am going to try him on a trial of flomax.

## 2023-04-14 NOTE — Assessment & Plan Note (Signed)
 He is to continue on his statins and we will check his FLP at this time.

## 2023-04-14 NOTE — Assessment & Plan Note (Signed)
 He is not on diuretic at this time and there is no evidence of volume overload.

## 2023-04-14 NOTE — Assessment & Plan Note (Signed)
 We will check his platelet levels at this time.  He is not having any problems with bleeding.

## 2023-04-14 NOTE — Assessment & Plan Note (Signed)
 His BP is currently controlled and we will continue on his current medications.

## 2023-04-14 NOTE — Assessment & Plan Note (Signed)
I want him to eat healthy, exercise and lose weight. 

## 2023-04-15 LAB — MICROALBUMIN / CREATININE URINE RATIO
Creatinine, Urine: 116.2 mg/dL
Microalb/Creat Ratio: 10 mg/g{creat} (ref 0–29)
Microalbumin, Urine: 11.7 ug/mL

## 2023-04-15 LAB — CBC WITH DIFFERENTIAL/PLATELET

## 2023-04-15 LAB — CMP14 + ANION GAP
ALT: 15 IU/L (ref 0–44)
AST: 18 IU/L (ref 0–40)
Albumin: 4.1 g/dL (ref 3.8–4.8)
Alkaline Phosphatase: 89 IU/L (ref 44–121)
Anion Gap: 19 mmol/L — ABNORMAL HIGH (ref 10.0–18.0)
BUN/Creatinine Ratio: 14 (ref 10–24)
BUN: 22 mg/dL (ref 8–27)
Bilirubin Total: 0.5 mg/dL (ref 0.0–1.2)
CO2: 22 mmol/L (ref 20–29)
Calcium: 8.7 mg/dL (ref 8.6–10.2)
Chloride: 102 mmol/L (ref 96–106)
Creatinine, Ser: 1.58 mg/dL — ABNORMAL HIGH (ref 0.76–1.27)
Globulin, Total: 2.5 g/dL (ref 1.5–4.5)
Glucose: 109 mg/dL — ABNORMAL HIGH (ref 70–99)
Potassium: 4.4 mmol/L (ref 3.5–5.2)
Sodium: 143 mmol/L (ref 134–144)
Total Protein: 6.6 g/dL (ref 6.0–8.5)
eGFR: 44 mL/min/{1.73_m2} — ABNORMAL LOW (ref >59)

## 2023-04-15 LAB — LIPID PANEL
Chol/HDL Ratio: 4.4 ratio (ref 0.0–5.0)
Cholesterol, Total: 141 mg/dL (ref 100–199)
HDL: 32 mg/dL — ABNORMAL LOW (ref >39)
LDL Chol Calc (NIH): 77 mg/dL (ref 0–99)
Triglycerides: 186 mg/dL — ABNORMAL HIGH (ref 0–149)
VLDL Cholesterol Cal: 32 mg/dL (ref 5–40)

## 2023-04-15 LAB — HEMOGLOBIN A1C
Est. average glucose Bld gHb Est-mCnc: 148 mg/dL
Hgb A1c MFr Bld: 6.8 % — ABNORMAL HIGH (ref 4.8–5.6)

## 2023-04-15 LAB — URIC ACID: Uric Acid: 4.8 mg/dL (ref 3.8–8.4)

## 2023-04-15 LAB — TSH: TSH: 3.12 u[IU]/mL (ref 0.450–4.500)

## 2023-04-15 LAB — PSA: Prostate Specific Ag, Serum: 2.4 ng/mL (ref 0.0–4.0)

## 2023-04-15 LAB — DIGOXIN LEVEL: Digoxin, Serum: 0.7 ng/mL (ref 0.5–0.9)

## 2023-04-21 NOTE — Progress Notes (Signed)
 His labs look good. No change in his CKD.  Patient is aware of labs

## 2023-04-25 ENCOUNTER — Other Ambulatory Visit: Payer: Self-pay | Admitting: Internal Medicine

## 2023-05-03 ENCOUNTER — Other Ambulatory Visit: Payer: Self-pay | Admitting: Internal Medicine

## 2023-05-20 ENCOUNTER — Other Ambulatory Visit: Payer: Self-pay | Admitting: Internal Medicine

## 2023-05-31 ENCOUNTER — Other Ambulatory Visit: Payer: Self-pay | Admitting: Internal Medicine

## 2023-05-31 MED ORDER — INSULIN LISPRO (1 UNIT DIAL) 100 UNIT/ML (KWIKPEN): PEN_INJECTOR | SUBCUTANEOUS | 3 refills | Status: AC

## 2023-06-02 DIAGNOSIS — E119 Type 2 diabetes mellitus without complications: Secondary | ICD-10-CM | POA: Diagnosis not present

## 2023-06-23 ENCOUNTER — Other Ambulatory Visit: Payer: Self-pay | Admitting: Internal Medicine

## 2023-07-14 ENCOUNTER — Encounter: Payer: Self-pay | Admitting: Internal Medicine

## 2023-07-14 ENCOUNTER — Ambulatory Visit: Admitting: Internal Medicine

## 2023-07-14 VITALS — BP 120/78 | HR 78 | Temp 98.2°F | Resp 17 | Ht 71.0 in | Wt 281.2 lb

## 2023-07-14 DIAGNOSIS — I10 Essential (primary) hypertension: Secondary | ICD-10-CM

## 2023-07-14 DIAGNOSIS — E1121 Type 2 diabetes mellitus with diabetic nephropathy: Secondary | ICD-10-CM | POA: Diagnosis not present

## 2023-07-14 DIAGNOSIS — N1832 Chronic kidney disease, stage 3b: Secondary | ICD-10-CM

## 2023-07-14 NOTE — Progress Notes (Signed)
 Office Visit  Subjective   Patient ID: Dennis Nunez   DOB: 1945/12/01   Age: 78 y.o.   MRN: [id number]   Chief Complaint Chief Complaint  Patient presents with   Follow-up     History of Present Illness The patient is a 78 year old Caucasian/White male who returns for a follow-up visit for his T2 diabetes.  Since his last visit, he has not had any problems.  This past year, his A1c was not at goal and we increased his tresiba  from 44 Units to 50 Units daily.  He had problems with diarrhea in the past but he has not had any recurrent diarrhea on his current ozempic  dose.  This past year, I started him on farxiga  for diabetic nephropathy.  He has had symptoms of hypoglycemia in the past where he was admitted to [facility] in 01/2021 for presyncope.  This was due to him taking his insulin  and not eating and he was also having diarrhea at that time.  He remains on tresiba  50 units daily and humalog  10 units (he decreased from 16) before meals which is usually twice per day, farxiga  10mg  daily and ozempic  0.5mg  subcut weekly.  He is not walking as much as they would like.  He specifically denies unexplained abdominal pain, nausea or vomiting, diarrhea, or documented hypoglycemia where he has had no more hypoglycemia since his hospitalization in 01/2021.  We did obtain a freestyle libre but he has not been using it lately.  He checks his FSBS daily and his sugars run 90-130's.   His last HgbA1c was done 3 months ago and was 6.8%. He has no diabetic retinopathy or neuropathy but he does have probable diabetic and hypertensive nephropathy with Stage IIIb chronic kidney disease.  His last dilated eye exam was done by Dr. Lemond at [facility] on 10/06/2022 and this also showed no evidence of diabetic retinopathy.   The patient has Stage IIIb CKD which is probably due to his T2 diabetes and his hypertension.  His creatinine has ranged 1.4-1.6 with a GFR 33-45 where he has been noted by our practice to have CKD since  2011.  He is currently on farxiga , ozempic , and lisinopril .  He has been avoiding NSAIDS.  The patient is a 78 year old Caucasian/White male who presents for a follow-up evaluation of hypertension.  Since his last visit, he has not had any problems.  This past year, the patient had problems with orthostatic hypotension where he was seen in the ER on 08/30/2022 due to syncope.  This may have been related to his diuretic use and heat where he was working in the yard.  I did stop his lasix  and he denies any more problems with dizziness/orthostasis.  The patient has been checking his blood pressure at home.  He states his systolic BP runs 869-859'd.   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  He uses a stationary bike for 30-40 min.   The patient's current medications include: lisinopril  20 mg daily and Toprol  XL 25 mg daily. The patient has been tolerating his medications well. The patient denies any headache, visual changes, dizziness, lightheadness, chest pain, shortness of breath, and weakness/numbness.  He reports there have been no other symptoms noted.     Past Medical History Past Medical History:  Diagnosis Date   Atrial fibrillation (HCC)    takes Dilt but not apparently [facility]   BPH (benign  prostatic hyperplasia)    Cardiomyopathy (HCC)    Chronic diastolic (congestive) heart failure (HCC)    Chronic venous insufficiency    CKD (chronic kidney disease), stage III (HCC)    Diabetic nephropathy associated with type 2 diabetes mellitus (HCC)    Dyslipidemia    GERD (gastroesophageal reflux disease)    Gout    History of gastric ulcer    Hyperlipidemia    Left bundle branch block (LBBB)    Paroxysmal atrial tachycardia (HCC)    Thrombocytopenia (HCC)      Allergies Allergies  Allergen Reactions   Penicillins Hives and Rash    Has patient had a PCN reaction causing immediate rash, facial/tongue/throat swelling, SOB or  lightheadedness with hypotension: No  Has patient had a PCN reaction causing severe rash involving mucus membranes or skin necrosis: No  Has patient had a PCN reaction that required hospitalization: No  Has patient had a PCN reaction occurring within the last 10 years: No  If all of the above answers are NO, then may proceed with Cephalosporin use.   Aspirin      Medications  Current Outpatient Medications:    ACCU-CHEK GUIDE TEST test strip, USE AS DIRECTED (INSULIN  USE), Disp: 100 strip, Rfl: 3   Accu-Chek Softclix Lancets lancets, USE AS DIRECTED, Disp: 100 each, Rfl: 5   allopurinol (ZYLOPRIM) 100 MG tablet, TAKE 2 TABLETS BY MOUTH EVERY DAY, Disp: 180 tablet, Rfl: 1   B-D ULTRAFINE III SHORT PEN 31G X 8 MM MISC, Use 3 times a day with his insulin  pen., Disp: 150 each, Rfl: 3   cholecalciferol (VITAMIN D) 1000 units tablet, Take 1,000 Units by mouth daily., Disp: , Rfl:    Continuous Blood Gluc Receiver (FREESTYLE LIBRE 3 READER) DEVI, 1 kit by Does not apply route every 14 (fourteen) days., Disp: 1 each, Rfl: 0   Continuous Blood Gluc Sensor (FREESTYLE LIBRE 3 SENSOR) MISC, Place 1 sensor on the skin every 14 days. Use to check glucose continuously, Disp: 2 each, Rfl: 3   digoxin  (LANOXIN ) 0.125 MG tablet, TAKE 1 TABLET BY MOUTH EVERY DAY, Disp: 90 tablet, Rfl: 1   FARXIGA  10 MG TABS tablet, TAKE 1 TABLET (10 MG TOTAL) BY MOUTH EVERY MORNING., Disp: 90 tablet, Rfl: 0   ferrous sulfate 325 (65 FE) MG tablet, Take 325 mg by mouth daily., Disp: , Rfl: 3   furosemide  (LASIX ) 40 MG tablet, TAKE 1 TABLET BY MOUTH TWICE A DAY, Disp: 60 tablet, Rfl: 1   insulin  degludec (TRESIBA  FLEXTOUCH) 100 UNIT/ML FlexTouch Pen, Inject 44 Units into the skin at bedtime., Disp: 3 mL, Rfl: 11   insulin  lispro (HUMALOG  KWIKPEN) 100 UNIT/ML KwikPen, INJECT 10 UNITS UNDER THE SKIN BEFORE EACH MEAL, Disp: 3 mL, Rfl: 3   KLOR-CON M20 20 MEQ tablet, TAKE 1 TABLET BY MOUTH THREE TIMES A DAY, Disp: 270 tablet,  Rfl: 1   lisinopril  (ZESTRIL ) 40 MG tablet, TAKE 1/2 TABLET BY MOUTH DAILY, Disp: 45 tablet, Rfl: 7   loperamide (IMODIUM) 2 MG capsule, Take 4 mg by mouth as needed for diarrhea or loose stools., Disp: , Rfl:    metoprolol  succinate (TOPROL -XL) 25 MG 24 hr tablet, TAKE 1 TABLET (25 MG TOTAL) BY MOUTH DAILY., Disp: 90 tablet, Rfl: 0   niacin (VITAMIN B3) 500 MG ER tablet, TAKE 1 TABLET BY MOUTH AT BEDTIME AFTER A LOW-FAT SNACK, Disp: 90 tablet, Rfl: 1   omeprazole  (PRILOSEC) 20 MG capsule, Take 1 capsule (20 mg total)  by mouth daily. Before breakfast, Disp: 90 capsule, Rfl: 3   pravastatin (PRAVACHOL) 40 MG tablet, TAKE 1 TABLET BY MOUTH EVERY DAY AT BEDTIME AS DIRECTED, Disp: 90 tablet, Rfl: 1   Semaglutide ,0.25 or 0.5MG /DOS, (OZEMPIC , 0.25 OR 0.5 MG/DOSE,) 2 MG/3ML SOPN, INJECT 0.5 MG INTO THE SKIN ONE TIME PER WEEK, Disp: 0.5 mL, Rfl: 1   tamsulosin  (FLOMAX ) 0.4 MG CAPS capsule, Take 1 capsule (0.4 mg total) by mouth daily after supper., Disp: 90 capsule, Rfl: 3   Review of Systems Review of Systems  Constitutional:  Negative for chills and fever.  Eyes:  Negative for blurred vision and double vision.  Respiratory:  Negative for cough and shortness of breath.   Cardiovascular:  Negative for chest pain, palpitations and leg swelling.  Gastrointestinal:  Negative for abdominal pain, constipation, diarrhea, nausea and vomiting.  Genitourinary:  Negative for frequency.  Musculoskeletal:  Negative for myalgias.  Skin:  Negative for itching and rash.  Neurological:  Negative for dizziness, weakness and headaches.  Endo/Heme/Allergies:  Negative for polydipsia.       Objective:    Vitals BP 120/78   Pulse 78   Temp 98.2 F (36.8 C)   Resp 17   Ht 5' 11 (1.803 m)   Wt 281 lb 3.2 oz (127.6 kg)   SpO2 96%   BMI 39.22 kg/m    Physical Examination Physical Exam Constitutional:      Appearance: Normal appearance. He is not ill-appearing.  Cardiovascular:     Rate and Rhythm: Normal  rate and regular rhythm.     Pulses: Normal pulses.     Heart sounds: No murmur heard.    No friction rub. No gallop.  Pulmonary:     Effort: Pulmonary effort is normal. No respiratory distress.     Breath sounds: No wheezing, rhonchi or rales.  Abdominal:     General: Abdomen is flat. Bowel sounds are normal. There is no distension.     Palpations: Abdomen is soft.     Tenderness: There is no abdominal tenderness.  Musculoskeletal:     Right lower leg: No edema.     Left lower leg: No edema.  Skin:    General: Skin is warm and dry.     Findings: No rash.  Neurological:     General: No focal deficit present.     Mental Status: He is alert and oriented to person, place, and time.  Psychiatric:        Mood and Affect: Mood normal.        Behavior: Behavior normal.        Assessment & Plan:   Diabetic nephropathy associated with type 2 diabetes mellitus (HCC) His blood sugars have been controlled on his current meds.  He has diabetic nephropathy and we will continue to try to get a goal HgBA1c <7%.  We will check his HgBA1c on his next visit.  Stage 3b chronic kidney disease (HCC) He has Stage IIIb CKD from diabetes and hypertension.  He is to avoid NSAIDS.  We will contninue control of his diabetes and HTN.  He needs to work on his weight and lose weight.  Essential hypertension His BP is at goal.  We will continue to monitor.    Return in about 3 months (around 10/14/2023).   Selinda Fleeta Finger, MD

## 2023-07-14 NOTE — Assessment & Plan Note (Signed)
 His blood sugars have been controlled on his current meds.  He has diabetic nephropathy and we will continue to try to get a goal HgBA1c <7%.  We will check his HgBA1c on his next visit.

## 2023-07-14 NOTE — Assessment & Plan Note (Signed)
 His BP is at goal.  We will continue to monitor.

## 2023-07-14 NOTE — Assessment & Plan Note (Signed)
 He has Stage IIIb CKD from diabetes and hypertension.  He is to avoid NSAIDS.  We will contninue control of his diabetes and HTN.  He needs to work on his weight and lose weight.

## 2023-07-27 ENCOUNTER — Other Ambulatory Visit: Payer: Self-pay | Admitting: Internal Medicine

## 2023-08-08 ENCOUNTER — Other Ambulatory Visit: Payer: Self-pay

## 2023-08-08 MED ORDER — DAPAGLIFLOZIN PROPANEDIOL 10 MG PO TABS: 10.0000 mg | ORAL_TABLET | Freq: Every day | ORAL | 0 refills | Status: DC

## 2023-08-18 ENCOUNTER — Other Ambulatory Visit: Payer: Self-pay | Admitting: Internal Medicine

## 2023-08-22 ENCOUNTER — Other Ambulatory Visit: Payer: Self-pay | Admitting: Internal Medicine

## 2023-10-04 ENCOUNTER — Other Ambulatory Visit: Payer: Self-pay | Admitting: Internal Medicine

## 2023-10-14 ENCOUNTER — Ambulatory Visit: Admitting: Internal Medicine

## 2023-10-14 ENCOUNTER — Encounter: Payer: Self-pay | Admitting: Internal Medicine

## 2023-10-14 VITALS — BP 130/70 | HR 64 | Temp 98.5°F | Resp 18 | Ht 71.0 in | Wt 284.0 lb

## 2023-10-14 DIAGNOSIS — G8929 Other chronic pain: Secondary | ICD-10-CM | POA: Insufficient documentation

## 2023-10-14 DIAGNOSIS — M25569 Pain in unspecified knee: Secondary | ICD-10-CM | POA: Diagnosis not present

## 2023-10-14 DIAGNOSIS — N1832 Chronic kidney disease, stage 3b: Secondary | ICD-10-CM

## 2023-10-14 DIAGNOSIS — E1121 Type 2 diabetes mellitus with diabetic nephropathy: Secondary | ICD-10-CM | POA: Diagnosis not present

## 2023-10-14 NOTE — Assessment & Plan Note (Signed)
 We will refer him to [facility] as he wants a knee injection at this time.

## 2023-10-14 NOTE — Assessment & Plan Note (Signed)
 We will cotninue to control his diabetes and HTN.  He will continue on ozempic , lisinopril  and farxiga .  He will avoid NSAIDS.

## 2023-10-14 NOTE — Assessment & Plan Note (Signed)
 We will recheck his HgBa1c at this time.  He will continue on his medications and he denies any hypoglycemia.

## 2023-10-14 NOTE — Progress Notes (Signed)
 Office Visit  Subjective   Patient ID: Dennis Nunez   DOB: 11-06-1945   Age: 78 y.o.   MRN: [id number]   Chief Complaint Chief Complaint  Patient presents with   Follow-up    3 Month follow up     History of Present Illness The patient is a 78 year old Caucasian/White male who returns for a follow-up visit for his T2 diabetes.  Since his last visit, he has not had any problems.  This past year, his A1c was not at goal and we increased his tresiba  from 44 Units to 50 Units daily.  He had problems with diarrhea in the past but he has not had any recurrent diarrhea on his current ozempic  dose.  This past year, I started him on farxiga  for diabetic nephropathy.  He has had symptoms of hypoglycemia in the past where he was admitted to [facility] in 01/2021 for presyncope.  This was due to him taking his insulin  and not eating and he was also having diarrhea at that time.  He remains on tresiba  50 units daily and humalog  10 units (he decreased from 16) before meals which is usually twice per day, farxiga  10mg  daily and ozempic  0.5mg  subcut weekly.  He is not walking as much as they would like.  He specifically denies unexplained abdominal pain, nausea or vomiting, diarrhea, or documented hypoglycemia where he has had no more hypoglycemia since his hospitalization in 01/2021.  We did obtain a freestyle libre but he has not been using it lately.  He checks his FSBS daily and his sugars run 90-130's.   His last HgbA1c was done 3 months ago and was 6.8%. He has no diabetic retinopathy or neuropathy but he does have probable diabetic and hypertensive nephropathy with Stage IIIb chronic kidney disease.  His last dilated eye exam was done by Dr. Lemond at [facility] on 10/06/2022 and this also showed no evidence of diabetic retinopathy.   The patient has Stage IIIb CKD which is probably due to his T2 diabetes and his hypertension.  His creatinine has ranged 1.4-1.6 with a GFR 33-45 where he has been noted by our practice  to have CKD since 2011.  He is currently on farxiga , ozempic , and lisinopril .  He has been avoiding NSAIDS.   The patient states that he has been having left knee pain that started about a year ago.  This pain is a sharp intermittent pain that occurs when he first gets up.  He wants to be referred to orthopedics for evaluation.     Past Medical History Past Medical History:  Diagnosis Date   Atrial fibrillation (HCC)    takes Dilt but not apparently AC   BPH (benign prostatic hyperplasia)    Cardiomyopathy (HCC)    Chronic diastolic (congestive) heart failure (HCC)    Chronic venous insufficiency    CKD (chronic kidney disease), stage III (HCC)    Diabetic nephropathy associated with type 2 diabetes mellitus (HCC)    Dyslipidemia    GERD (gastroesophageal reflux disease)    Gout    History of gastric ulcer    Hyperlipidemia    Left bundle branch block (LBBB)    Paroxysmal atrial tachycardia    Thrombocytopenia      Allergies Allergies  Allergen Reactions   Penicillins Hives and Rash    Has patient had a PCN reaction causing immediate rash, facial/tongue/throat swelling, SOB or lightheadedness with hypotension: No  Has patient had a PCN reaction causing severe  rash involving mucus membranes or skin necrosis: No  Has patient had a PCN reaction that required hospitalization: No  Has patient had a PCN reaction occurring within the last 10 years: No  If all of the above answers are NO, then may proceed with Cephalosporin use.   Aspirin      Medications  Current Outpatient Medications:    ACCU-CHEK GUIDE TEST test strip, USE AS DIRECTED (INSULIN  USE), Disp: 100 strip, Rfl: 3   Accu-Chek Softclix Lancets lancets, USE AS DIRECTED, Disp: 100 each, Rfl: 5   allopurinol (ZYLOPRIM) 100 MG tablet, TAKE 2 TABLETS BY MOUTH EVERY DAY, Disp: 180 tablet, Rfl: 1   B-D ULTRAFINE III SHORT PEN 31G X 8 MM MISC, Use 3 times a day with his insulin  pen., Disp: 150 each, Rfl: 3    cholecalciferol (VITAMIN D) 1000 units tablet, Take 1,000 Units by mouth daily., Disp: , Rfl:    Continuous Blood Gluc Receiver (FREESTYLE LIBRE 3 READER) DEVI, 1 kit by Does not apply route every 14 (fourteen) days., Disp: 1 each, Rfl: 0   Continuous Blood Gluc Sensor (FREESTYLE LIBRE 3 SENSOR) MISC, Place 1 sensor on the skin every 14 days. Use to check glucose continuously, Disp: 2 each, Rfl: 3   digoxin  (LANOXIN ) 0.125 MG tablet, TAKE 1 TABLET BY MOUTH EVERY DAY, Disp: 90 tablet, Rfl: 1   FARXIGA  10 MG TABS tablet, TAKE 1 TABLET BY MOUTH EVERY DAY, Disp: 90 tablet, Rfl: 0   ferrous sulfate 325 (65 FE) MG tablet, Take 325 mg by mouth daily., Disp: , Rfl: 3   furosemide  (LASIX ) 40 MG tablet, TAKE 1 TABLET BY MOUTH TWICE A DAY, Disp: 180 tablet, Rfl: 1   insulin  degludec (TRESIBA  FLEXTOUCH) 100 UNIT/ML FlexTouch Pen, INJECT 44 UNITS INTO THE SKIN AT BEDTIME., Disp: 15 mL, Rfl: 11   insulin  lispro (HUMALOG  KWIKPEN) 100 UNIT/ML KwikPen, INJECT 10 UNITS UNDER THE SKIN BEFORE EACH MEAL, Disp: 3 mL, Rfl: 3   lisinopril  (ZESTRIL ) 40 MG tablet, TAKE 1/2 TABLET BY MOUTH DAILY, Disp: 45 tablet, Rfl: 7   loperamide (IMODIUM) 2 MG capsule, Take 4 mg by mouth as needed for diarrhea or loose stools., Disp: , Rfl:    metoprolol  succinate (TOPROL -XL) 25 MG 24 hr tablet, TAKE 1 TABLET (25 MG TOTAL) BY MOUTH DAILY., Disp: 90 tablet, Rfl: 0   niacin (VITAMIN B3) 500 MG ER tablet, TAKE 1 TABLET BY MOUTH AT BEDTIME AFTER A LOW-FAT SNACK, Disp: 90 tablet, Rfl: 1   omeprazole  (PRILOSEC) 20 MG capsule, Take 1 capsule (20 mg total) by mouth daily. Before breakfast, Disp: 90 capsule, Rfl: 3   potassium chloride SA (KLOR-CON M) 20 MEQ tablet, TAKE 1 TABLET BY MOUTH THREE TIMES A DAY, Disp: 270 tablet, Rfl: 1   pravastatin (PRAVACHOL) 40 MG tablet, TAKE 1 TABLET BY MOUTH EVERY DAY AT BEDTIME AS DIRECTED, Disp: 90 tablet, Rfl: 1   Semaglutide ,0.25 or 0.5MG /DOS, (OZEMPIC , 0.25 OR 0.5 MG/DOSE,) 2 MG/3ML SOPN, INJECT 0.5 MG INTO  THE SKIN ONE TIME PER WEEK, Disp: 3 mL, Rfl: 1   tamsulosin  (FLOMAX ) 0.4 MG CAPS capsule, Take 1 capsule (0.4 mg total) by mouth daily after supper., Disp: 90 capsule, Rfl: 3   Review of Systems Review of Systems  Constitutional:  Negative for chills, fever, malaise/fatigue and weight loss.  Eyes:  Negative for blurred vision and double vision.  Respiratory:  Negative for cough and shortness of breath.   Cardiovascular:  Negative for chest pain, palpitations and leg  swelling.  Gastrointestinal:  Negative for abdominal pain, constipation, diarrhea, heartburn, nausea and vomiting.  Genitourinary:  Negative for frequency.  Musculoskeletal:  Negative for myalgias.  Skin:  Negative for itching and rash.  Neurological:  Negative for dizziness, weakness and headaches.  Endo/Heme/Allergies:  Negative for polydipsia.       Objective:    Vitals BP 130/70   Pulse 64   Temp 98.5 F (36.9 C) (Temporal)   Resp 18   Ht 5' 11 (1.803 m)   Wt 284 lb (128.8 kg)   SpO2 98%   BMI 39.61 kg/m    Physical Examination Physical Exam Constitutional:      Appearance: Normal appearance. He is not ill-appearing.  Cardiovascular:     Rate and Rhythm: Normal rate and regular rhythm.     Pulses: Normal pulses.     Heart sounds: No murmur heard.    No friction rub. No gallop.  Pulmonary:     Effort: Pulmonary effort is normal. No respiratory distress.     Breath sounds: No wheezing, rhonchi or rales.  Abdominal:     General: Abdomen is flat. Bowel sounds are normal. There is no distension.     Palpations: Abdomen is soft.     Tenderness: There is no abdominal tenderness.  Musculoskeletal:     Right lower leg: No edema.     Left lower leg: No edema.  Skin:    General: Skin is warm and dry.     Findings: No rash.  Neurological:     General: No focal deficit present.     Mental Status: He is alert and oriented to person, place, and time.  Psychiatric:        Mood and Affect: Mood normal.         Behavior: Behavior normal.        Assessment & Plan:   Diabetic nephropathy associated with type 2 diabetes mellitus (HCC) We will recheck his HgBa1c at this time.  He will continue on his medications and he denies any hypoglycemia.  Stage 3b chronic kidney disease (HCC) We will cotninue to control his diabetes and HTN.  He will continue on ozempic , lisinopril  and farxiga .  He will avoid NSAIDS.  Chronic knee pain We will refer him to [facility] as he wants a knee injection at this time.    Return in about 3 months (around 01/14/2024).   Selinda Fleeta Finger, MD

## 2023-10-15 LAB — LIPID PANEL
Chol/HDL Ratio: 4.7 ratio (ref 0.0–5.0)
Cholesterol, Total: 141 mg/dL (ref 100–199)
HDL: 30 mg/dL — ABNORMAL LOW (ref >39)
LDL Chol Calc (NIH): 80 mg/dL (ref 0–99)
Triglycerides: 183 mg/dL — ABNORMAL HIGH (ref 0–149)
VLDL Cholesterol Cal: 31 mg/dL (ref 5–40)

## 2023-10-15 LAB — HEMOGLOBIN A1C
Est. average glucose Bld gHb Est-mCnc: 137 mg/dL
Hgb A1c MFr Bld: 6.4 % — ABNORMAL HIGH (ref 4.8–5.6)

## 2023-10-24 ENCOUNTER — Ambulatory Visit: Payer: Self-pay

## 2023-10-24 NOTE — Progress Notes (Signed)
 Patient called.  Patient aware.

## 2023-11-07 ENCOUNTER — Other Ambulatory Visit: Payer: Self-pay

## 2023-11-07 MED ORDER — BD PEN NEEDLE SHORT ULTRAFINE 31G X 8 MM MISC: 3.0000 | Freq: Three times a day (TID) | 3 refills | Status: DC

## 2023-11-07 NOTE — Progress Notes (Signed)
 Refilled pen needles to [facility] on [address]

## 2023-11-09 ENCOUNTER — Other Ambulatory Visit: Payer: Self-pay

## 2023-11-09 MED ORDER — BD PEN NEEDLE SHORT ULTRAFINE 31G X 8 MM MISC: 1.0000 | Freq: Three times a day (TID) | 3 refills | Status: DC

## 2024-01-13 ENCOUNTER — Encounter: Payer: Self-pay | Admitting: Internal Medicine

## 2024-01-13 ENCOUNTER — Ambulatory Visit: Admitting: Internal Medicine

## 2024-01-13 VITALS — BP 130/74 | HR 79 | Temp 97.9°F | Resp 18 | Ht 71.0 in | Wt 284.0 lb

## 2024-01-13 DIAGNOSIS — I10 Essential (primary) hypertension: Secondary | ICD-10-CM | POA: Diagnosis not present

## 2024-01-13 DIAGNOSIS — Z6839 Body mass index (BMI) 39.0-39.9, adult: Secondary | ICD-10-CM | POA: Diagnosis not present

## 2024-01-13 DIAGNOSIS — E785 Hyperlipidemia, unspecified: Secondary | ICD-10-CM | POA: Diagnosis not present

## 2024-01-13 DIAGNOSIS — E1121 Type 2 diabetes mellitus with diabetic nephropathy: Secondary | ICD-10-CM

## 2024-01-13 NOTE — Progress Notes (Signed)
 "  Office Visit  Subjective   Patient ID: Dennis Nunez   DOB: 05/19/1945   Age: 79 y.o.   MRN: [id number]   Chief Complaint Chief Complaint  Patient presents with   Follow-up    3 month follow up   Tresiba  will no longer be covered under his insurance as of 01-05-24, the alternatives are either Lantus  or Toujeo . Per insurance     History of Present Illness 79 years old male is here for follow up. He says he has upper respiratory infection last week. He is feeling better.   He has type 2 diabetes mellitus and he has been using Tresiba  50 units at night and NovoLog  10-15 units with mealtime.  He tells me that he only check blood sugar once a day and do not like to use continuous glucose monitor.  His hemoglobin A1c was 6.8 and he has few episodes of hypoglycemia.  He has checked his sugar at home and he says that it usually stays 120-130 range most of the time.  He has seen eye doctor in May last year and there was no diabetic changes and his next appointment will be next May.  He do not have microalbuminuria.  He has a chronic kidney disease and his GFR has been stable and on last blood draw was 44 that make it CKD 3B.  He is due for annual visit on next visit and will repeat labs on next visit.  He says that [facility] nurses going to come to his home to do annual visit as well.  He tells me that his insurance may no longer cover Tresiba  in coming month.    He has hyperlipidemia and his LDL was 80 on October 14, 2023.  He takes pravastatin 40 mg daily.    He has hypertension and his blood pressure is controlled.  He also has paroxysmal atrial fibrillation and his rate is controlled.  Past Medical History Past Medical History:  Diagnosis Date   Atrial fibrillation (HCC)    takes Dilt but not apparently AC   BPH (benign prostatic hyperplasia)    Cardiomyopathy (HCC)    Chronic diastolic (congestive) heart failure (HCC)    Chronic venous insufficiency    CKD (chronic kidney disease), stage  III (HCC)    Diabetic nephropathy associated with type 2 diabetes mellitus (HCC)    Dyslipidemia    GERD (gastroesophageal reflux disease)    Gout    History of gastric ulcer    Hyperlipidemia    Left bundle branch block (LBBB)    Paroxysmal atrial tachycardia    Thrombocytopenia      Allergies Allergies[1]   Review of Systems Review of Systems  Constitutional: Negative.   HENT: Negative.    Respiratory: Negative.    Cardiovascular: Negative.   Gastrointestinal: Negative.   Neurological: Negative.        Objective:    Vitals BP 130/74   Pulse 79   Temp 97.9 F (36.6 C)   Resp 18   Ht 5' 11 (1.803 m)   Wt 284 lb (128.8 kg)   SpO2 97%   BMI 39.61 kg/m    Physical Examination Physical Exam Constitutional:      Appearance: Normal appearance. He is obese.  HENT:     Head: Normocephalic and atraumatic.  Cardiovascular:     Rate and Rhythm: Normal rate and regular rhythm.     Heart sounds: Normal heart sounds.  Pulmonary:     Effort: Pulmonary effort  is normal.     Breath sounds: Normal breath sounds.  Abdominal:     General: Bowel sounds are normal.     Palpations: Abdomen is soft.  Neurological:     General: No focal deficit present.     Mental Status: He is alert and oriented to person, place, and time.        Assessment & Plan:   Essential hypertension   His blood pressure is well controlled.  Diabetic nephropathy associated with type 2 diabetes mellitus (HCC)   His chronic kidney disease is stable.  I have suggested to use continuous glucose monitor to avoid hypoglycemia.  He says that he will use that.  Will repeat labs on next visit.  He has a chronic kidney disease stage IIIB.  Morbid obesity (HCC)   His BMI is 39 with underlying hypertension and diabetes make it morbidly obese.  Dyslipidemia   His cholesterol is acceptable and he takes pravastatin 40 mg daily without any side effects.    Return in about 3 months (around 04/12/2024) for AWE  on next visit.   Roetta Dare, MD      [1]  Allergies Allergen Reactions   Penicillins Hives and Rash    Has patient had a PCN reaction causing immediate rash, facial/tongue/throat swelling, SOB or lightheadedness with hypotension: No  Has patient had a PCN reaction causing severe rash involving mucus membranes or skin necrosis: No  Has patient had a PCN reaction that required hospitalization: No  Has patient had a PCN reaction occurring within the last 10 years: No  If all of the above answers are NO, then may proceed with Cephalosporin use.   Aspirin    "

## 2024-01-13 NOTE — Assessment & Plan Note (Signed)
"    His BMI is 39 with underlying hypertension and diabetes make it morbidly obese. "

## 2024-01-13 NOTE — Assessment & Plan Note (Signed)
"    His cholesterol is acceptable and he takes pravastatin 40 mg daily without any side effects. "

## 2024-01-13 NOTE — Assessment & Plan Note (Signed)
His blood pressure is well-controlled 

## 2024-01-13 NOTE — Assessment & Plan Note (Signed)
"    His chronic kidney disease is stable.  I have suggested to use continuous glucose monitor to avoid hypoglycemia.  He says that he will use that.  Will repeat labs on next visit.  He has a chronic kidney disease stage IIIB. "

## 2024-01-23 ENCOUNTER — Other Ambulatory Visit: Payer: Self-pay

## 2024-01-23 MED ORDER — ACCU-CHEK SOFTCLIX LANCETS MISC: 3 refills | Status: DC

## 2024-01-23 MED ORDER — NIACIN ER (ANTIHYPERLIPIDEMIC) 500 MG PO TBCR: 500.0000 mg | EXTENDED_RELEASE_TABLET | Freq: Every day | ORAL | 1 refills | Status: AC

## 2024-01-23 MED ORDER — PRAVASTATIN SODIUM 40 MG PO TABS: 40.0000 mg | ORAL_TABLET | Freq: Every day | ORAL | 1 refills | Status: AC

## 2024-01-24 ENCOUNTER — Other Ambulatory Visit: Payer: Self-pay | Admitting: Internal Medicine

## 2024-01-24 MED ORDER — ACCU-CHEK SOFTCLIX LANCETS MISC: 3 refills | Status: AC

## 2024-01-26 ENCOUNTER — Other Ambulatory Visit: Payer: Self-pay

## 2024-01-26 MED ORDER — BD PEN NEEDLE SHORT ULTRAFINE 31G X 8 MM MISC: 1.0000 | Freq: Three times a day (TID) | 3 refills | Status: AC

## 2024-04-16 ENCOUNTER — Encounter: Admitting: Internal Medicine
